# Patient Record
Sex: Female | Born: 1945
Health system: Southern US, Community
[De-identification: ages and names within clinical notes are randomized; demographics above are authoritative.]

## PROBLEM LIST (undated history)

## (undated) DIAGNOSIS — I10 Essential (primary) hypertension: Secondary | ICD-10-CM

## (undated) DIAGNOSIS — K219 Gastro-esophageal reflux disease without esophagitis: Secondary | ICD-10-CM

## (undated) DIAGNOSIS — E785 Hyperlipidemia, unspecified: Secondary | ICD-10-CM

## (undated) DIAGNOSIS — J449 Chronic obstructive pulmonary disease, unspecified: Secondary | ICD-10-CM

## (undated) DIAGNOSIS — F419 Anxiety disorder, unspecified: Secondary | ICD-10-CM

## (undated) DIAGNOSIS — F329 Major depressive disorder, single episode, unspecified: Secondary | ICD-10-CM

## (undated) DIAGNOSIS — Z972 Presence of dental prosthetic device (complete) (partial): Secondary | ICD-10-CM

## (undated) DIAGNOSIS — H409 Unspecified glaucoma: Secondary | ICD-10-CM

## (undated) DIAGNOSIS — M1990 Unspecified osteoarthritis, unspecified site: Secondary | ICD-10-CM

## (undated) DIAGNOSIS — F32A Depression, unspecified: Secondary | ICD-10-CM

## (undated) DIAGNOSIS — J381 Polyp of vocal cord and larynx: Secondary | ICD-10-CM

## (undated) HISTORY — PX: CATARACT EXTRACTION: SUR2

## (undated) HISTORY — DX: Depression, unspecified: F32.A

## (undated) HISTORY — DX: Anxiety disorder, unspecified: F41.9

## (undated) HISTORY — PX: TONSILLECTOMY: SUR1361

## (undated) HISTORY — DX: Major depressive disorder, single episode, unspecified: F32.9

---

## 2004-05-26 ENCOUNTER — Ambulatory Visit: Payer: Self-pay | Admitting: Internal Medicine

## 2005-06-01 ENCOUNTER — Ambulatory Visit: Payer: Self-pay | Admitting: Internal Medicine

## 2006-06-07 ENCOUNTER — Ambulatory Visit: Payer: Self-pay | Admitting: Internal Medicine

## 2006-11-08 ENCOUNTER — Ambulatory Visit: Payer: Self-pay | Admitting: Unknown Physician Specialty

## 2006-11-08 HISTORY — PX: COLONOSCOPY: SHX5424

## 2007-06-13 ENCOUNTER — Ambulatory Visit: Payer: Self-pay | Admitting: Internal Medicine

## 2008-06-18 ENCOUNTER — Ambulatory Visit: Payer: Self-pay | Admitting: Internal Medicine

## 2009-06-20 ENCOUNTER — Ambulatory Visit: Payer: Self-pay | Admitting: Internal Medicine

## 2010-07-02 ENCOUNTER — Ambulatory Visit: Payer: Self-pay | Admitting: Internal Medicine

## 2010-07-06 HISTORY — PX: OTHER SURGICAL HISTORY: SHX169

## 2011-08-24 ENCOUNTER — Ambulatory Visit: Payer: Self-pay | Admitting: Internal Medicine

## 2011-12-08 ENCOUNTER — Ambulatory Visit: Payer: Self-pay | Admitting: Unknown Physician Specialty

## 2012-08-29 ENCOUNTER — Ambulatory Visit: Payer: Self-pay | Admitting: Internal Medicine

## 2013-09-04 ENCOUNTER — Ambulatory Visit: Payer: Self-pay | Admitting: Internal Medicine

## 2014-04-06 HISTORY — PX: COLONOSCOPY W/ POLYPECTOMY: SHX1380

## 2014-04-09 ENCOUNTER — Ambulatory Visit: Payer: Self-pay | Admitting: Unknown Physician Specialty

## 2014-04-11 LAB — PATHOLOGY REPORT

## 2014-07-17 DIAGNOSIS — M818 Other osteoporosis without current pathological fracture: Secondary | ICD-10-CM | POA: Insufficient documentation

## 2014-09-10 ENCOUNTER — Ambulatory Visit: Payer: Self-pay | Admitting: Internal Medicine

## 2014-10-28 NOTE — Op Note (Signed)
PATIENT NAME:  Kristen Potts, Kristen Potts MR#:  [id number] DATE OF BIRTH:  [birth year]  DATE OF PROCEDURE:  12/08/2011  PREOPERATIVE DIAGNOSIS: Bilateral vocal cord polyps.   POSTOPERATIVE DIAGNOSIS: Bilateral vocal cord polyps.  OPERATION PERFORMED: Microlaryngoscopy with excision of vocal cord polyps.   SURGEON: Roena Malady, M.D.   OPERATIVE FINDINGS: Bilateral seal-flipper-type vocal cord polyps.   DESCRIPTION OF PROCEDURE: [name] was identified in the holding area, taken to the operating room and placed in the supine position. After general endotracheal anesthesia, as the patient was edentulous, a gum guard was placed. The Dedo laryngoscope was introduced into the airway and an official laryngoscopy was performed. This was then suspended. The operating microscope was brought into the field. Examination of the larynx using the Shapshay instrument showed bilateral seal flipper fluid-filled polyps. A cottonoid pledget with phenylephrine lidocaine was placed in the larynx. After five minutes this was removed. Using the [facility] microlaryngeal instruments, the cup forceps, beginning on the left, were used to grasp the polypoid disease. This was gently pulled medially. The 45 degree scissors were then used to excise the polyp. Care was taken not to involve the vocal ligament. In a similar fashion, the polyp was removed from the right. This gave excellent removal of polyps bilaterally. The cottonoid pledget was removed. The mucosal layers were then gently reapproximated. Photodocumentation was done before and after surgery. With polyps removed and no active bleeding the patient was returned to anesthesia where she was extubated in the operating room and taken to the recovery room in stable condition.   CULTURES: None.         SPECIMENS: Bilateral vocal cord polyps.   ESTIMATED BLOOD LOSS: Less than 5 mL. ____________________________ Roena Malady, MD ctm:slb D: 12/08/2011 10:19:53  ET T: 12/08/2011 12:24:40 ET JOB#: [id number]  cc: Roena Malady, MD, <Dictator> Roena Malady MD ELECTRONICALLY SIGNED 12/30/2011 8:19

## 2015-06-24 ENCOUNTER — Other Ambulatory Visit: Payer: Self-pay | Admitting: Internal Medicine

## 2015-06-24 DIAGNOSIS — Z1231 Encounter for screening mammogram for malignant neoplasm of breast: Secondary | ICD-10-CM

## 2015-09-16 ENCOUNTER — Ambulatory Visit
Admission: RE | Admit: 2015-09-16 | Discharge: 2015-09-16 | Disposition: A | Discharge status: Home / Self Care | Payer: PPO | Source: Ambulatory Visit | Attending: Internal Medicine | Admitting: Internal Medicine

## 2015-09-16 DIAGNOSIS — Z1231 Encounter for screening mammogram for malignant neoplasm of breast: Secondary | ICD-10-CM | POA: Diagnosis not present

## 2015-11-18 DIAGNOSIS — M542 Cervicalgia: Secondary | ICD-10-CM | POA: Diagnosis not present

## 2015-11-18 DIAGNOSIS — R000 Tachycardia, unspecified: Secondary | ICD-10-CM | POA: Diagnosis not present

## 2015-11-25 DIAGNOSIS — N3090 Cystitis, unspecified without hematuria: Secondary | ICD-10-CM | POA: Diagnosis not present

## 2015-11-25 DIAGNOSIS — E782 Mixed hyperlipidemia: Secondary | ICD-10-CM | POA: Insufficient documentation

## 2015-11-25 DIAGNOSIS — R5383 Other fatigue: Secondary | ICD-10-CM | POA: Diagnosis not present

## 2015-12-09 DIAGNOSIS — R5383 Other fatigue: Secondary | ICD-10-CM | POA: Diagnosis not present

## 2015-12-09 DIAGNOSIS — M5010 Cervical disc disorder with radiculopathy, unspecified cervical region: Secondary | ICD-10-CM | POA: Diagnosis not present

## 2015-12-30 DIAGNOSIS — R5383 Other fatigue: Secondary | ICD-10-CM | POA: Diagnosis not present

## 2016-01-06 DIAGNOSIS — H43811 Vitreous degeneration, right eye: Secondary | ICD-10-CM | POA: Diagnosis not present

## 2016-01-06 DIAGNOSIS — Z9841 Cataract extraction status, right eye: Secondary | ICD-10-CM | POA: Diagnosis not present

## 2016-01-06 DIAGNOSIS — H2512 Age-related nuclear cataract, left eye: Secondary | ICD-10-CM | POA: Diagnosis not present

## 2016-01-06 DIAGNOSIS — H5203 Hypermetropia, bilateral: Secondary | ICD-10-CM | POA: Diagnosis not present

## 2016-01-06 DIAGNOSIS — H52223 Regular astigmatism, bilateral: Secondary | ICD-10-CM | POA: Diagnosis not present

## 2016-01-06 DIAGNOSIS — Z961 Presence of intraocular lens: Secondary | ICD-10-CM | POA: Diagnosis not present

## 2016-01-27 DIAGNOSIS — J0100 Acute maxillary sinusitis, unspecified: Secondary | ICD-10-CM | POA: Diagnosis not present

## 2016-01-27 DIAGNOSIS — Z23 Encounter for immunization: Secondary | ICD-10-CM | POA: Diagnosis not present

## 2016-01-27 DIAGNOSIS — R5383 Other fatigue: Secondary | ICD-10-CM | POA: Diagnosis not present

## 2016-02-18 DIAGNOSIS — R5383 Other fatigue: Secondary | ICD-10-CM | POA: Diagnosis not present

## 2016-02-18 DIAGNOSIS — L820 Inflamed seborrheic keratosis: Secondary | ICD-10-CM | POA: Diagnosis not present

## 2016-04-13 DIAGNOSIS — H5203 Hypermetropia, bilateral: Secondary | ICD-10-CM | POA: Diagnosis not present

## 2016-04-13 DIAGNOSIS — H2512 Age-related nuclear cataract, left eye: Secondary | ICD-10-CM | POA: Diagnosis not present

## 2016-04-13 DIAGNOSIS — H43811 Vitreous degeneration, right eye: Secondary | ICD-10-CM | POA: Diagnosis not present

## 2016-04-13 DIAGNOSIS — Z961 Presence of intraocular lens: Secondary | ICD-10-CM | POA: Diagnosis not present

## 2016-04-13 DIAGNOSIS — Z9841 Cataract extraction status, right eye: Secondary | ICD-10-CM | POA: Diagnosis not present

## 2016-04-13 DIAGNOSIS — H52223 Regular astigmatism, bilateral: Secondary | ICD-10-CM | POA: Diagnosis not present

## 2016-05-11 DIAGNOSIS — R5383 Other fatigue: Secondary | ICD-10-CM | POA: Diagnosis not present

## 2016-05-26 DIAGNOSIS — R5383 Other fatigue: Secondary | ICD-10-CM | POA: Diagnosis not present

## 2016-05-26 DIAGNOSIS — M769 Unspecified enthesopathy, lower limb, excluding foot: Secondary | ICD-10-CM | POA: Diagnosis not present

## 2016-06-23 DIAGNOSIS — M818 Other osteoporosis without current pathological fracture: Secondary | ICD-10-CM | POA: Diagnosis not present

## 2016-06-23 DIAGNOSIS — E782 Mixed hyperlipidemia: Secondary | ICD-10-CM | POA: Diagnosis not present

## 2016-06-23 DIAGNOSIS — Z79899 Other long term (current) drug therapy: Secondary | ICD-10-CM | POA: Diagnosis not present

## 2016-06-30 DIAGNOSIS — Z0000 Encounter for general adult medical examination without abnormal findings: Secondary | ICD-10-CM | POA: Diagnosis not present

## 2016-06-30 DIAGNOSIS — J431 Panlobular emphysema: Secondary | ICD-10-CM | POA: Insufficient documentation

## 2016-06-30 DIAGNOSIS — R7989 Other specified abnormal findings of blood chemistry: Secondary | ICD-10-CM | POA: Insufficient documentation

## 2016-06-30 DIAGNOSIS — J439 Emphysema, unspecified: Secondary | ICD-10-CM | POA: Diagnosis not present

## 2016-06-30 DIAGNOSIS — M5414 Radiculopathy, thoracic region: Secondary | ICD-10-CM | POA: Diagnosis not present

## 2016-06-30 DIAGNOSIS — M818 Other osteoporosis without current pathological fracture: Secondary | ICD-10-CM | POA: Diagnosis not present

## 2016-07-02 ENCOUNTER — Other Ambulatory Visit: Payer: Self-pay | Admitting: Internal Medicine

## 2016-07-02 DIAGNOSIS — Z1231 Encounter for screening mammogram for malignant neoplasm of breast: Secondary | ICD-10-CM

## 2016-07-20 DIAGNOSIS — M810 Age-related osteoporosis without current pathological fracture: Secondary | ICD-10-CM | POA: Diagnosis not present

## 2016-07-20 DIAGNOSIS — M818 Other osteoporosis without current pathological fracture: Secondary | ICD-10-CM | POA: Diagnosis not present

## 2016-09-21 ENCOUNTER — Ambulatory Visit
Admission: RE | Admit: 2016-09-21 | Discharge: 2016-09-21 | Disposition: A | Discharge status: Home / Self Care | Payer: PPO | Source: Ambulatory Visit | Attending: Internal Medicine | Admitting: Internal Medicine

## 2016-09-21 DIAGNOSIS — Z1231 Encounter for screening mammogram for malignant neoplasm of breast: Secondary | ICD-10-CM | POA: Diagnosis not present

## 2016-10-12 DIAGNOSIS — H5203 Hypermetropia, bilateral: Secondary | ICD-10-CM | POA: Diagnosis not present

## 2016-10-12 DIAGNOSIS — H52223 Regular astigmatism, bilateral: Secondary | ICD-10-CM | POA: Diagnosis not present

## 2016-10-12 DIAGNOSIS — H2512 Age-related nuclear cataract, left eye: Secondary | ICD-10-CM | POA: Diagnosis not present

## 2016-10-12 DIAGNOSIS — Z961 Presence of intraocular lens: Secondary | ICD-10-CM | POA: Diagnosis not present

## 2016-10-12 DIAGNOSIS — Z9841 Cataract extraction status, right eye: Secondary | ICD-10-CM | POA: Diagnosis not present

## 2016-10-12 DIAGNOSIS — H43811 Vitreous degeneration, right eye: Secondary | ICD-10-CM | POA: Diagnosis not present

## 2016-10-26 DIAGNOSIS — M76899 Other specified enthesopathies of unspecified lower limb, excluding foot: Secondary | ICD-10-CM | POA: Diagnosis not present

## 2016-10-26 DIAGNOSIS — Z0000 Encounter for general adult medical examination without abnormal findings: Secondary | ICD-10-CM | POA: Insufficient documentation

## 2016-10-26 DIAGNOSIS — J431 Panlobular emphysema: Secondary | ICD-10-CM | POA: Diagnosis not present

## 2017-02-22 DIAGNOSIS — N3090 Cystitis, unspecified without hematuria: Secondary | ICD-10-CM | POA: Diagnosis not present

## 2017-02-22 DIAGNOSIS — F3341 Major depressive disorder, recurrent, in partial remission: Secondary | ICD-10-CM | POA: Diagnosis not present

## 2017-02-22 DIAGNOSIS — J431 Panlobular emphysema: Secondary | ICD-10-CM | POA: Diagnosis not present

## 2017-02-22 DIAGNOSIS — E782 Mixed hyperlipidemia: Secondary | ICD-10-CM | POA: Diagnosis not present

## 2017-02-22 DIAGNOSIS — M818 Other osteoporosis without current pathological fracture: Secondary | ICD-10-CM | POA: Diagnosis not present

## 2017-03-15 DIAGNOSIS — F3341 Major depressive disorder, recurrent, in partial remission: Secondary | ICD-10-CM | POA: Diagnosis not present

## 2017-03-15 DIAGNOSIS — F5104 Psychophysiologic insomnia: Secondary | ICD-10-CM | POA: Diagnosis not present

## 2017-04-07 DIAGNOSIS — M76899 Other specified enthesopathies of unspecified lower limb, excluding foot: Secondary | ICD-10-CM | POA: Diagnosis not present

## 2017-04-07 DIAGNOSIS — E782 Mixed hyperlipidemia: Secondary | ICD-10-CM | POA: Diagnosis not present

## 2017-04-07 DIAGNOSIS — R7989 Other specified abnormal findings of blood chemistry: Secondary | ICD-10-CM | POA: Diagnosis not present

## 2017-04-07 DIAGNOSIS — Z79899 Other long term (current) drug therapy: Secondary | ICD-10-CM | POA: Diagnosis not present

## 2017-04-07 DIAGNOSIS — J431 Panlobular emphysema: Secondary | ICD-10-CM | POA: Diagnosis not present

## 2017-04-07 DIAGNOSIS — Z23 Encounter for immunization: Secondary | ICD-10-CM | POA: Diagnosis not present

## 2017-04-26 DIAGNOSIS — H43811 Vitreous degeneration, right eye: Secondary | ICD-10-CM | POA: Diagnosis not present

## 2017-04-26 DIAGNOSIS — Z961 Presence of intraocular lens: Secondary | ICD-10-CM | POA: Diagnosis not present

## 2017-04-26 DIAGNOSIS — H52223 Regular astigmatism, bilateral: Secondary | ICD-10-CM | POA: Diagnosis not present

## 2017-04-26 DIAGNOSIS — H5203 Hypermetropia, bilateral: Secondary | ICD-10-CM | POA: Diagnosis not present

## 2017-04-26 DIAGNOSIS — Z9841 Cataract extraction status, right eye: Secondary | ICD-10-CM | POA: Diagnosis not present

## 2017-04-26 DIAGNOSIS — H2512 Age-related nuclear cataract, left eye: Secondary | ICD-10-CM | POA: Diagnosis not present

## 2017-07-13 DIAGNOSIS — E559 Vitamin D deficiency, unspecified: Secondary | ICD-10-CM | POA: Diagnosis not present

## 2017-07-13 DIAGNOSIS — E782 Mixed hyperlipidemia: Secondary | ICD-10-CM | POA: Diagnosis not present

## 2017-07-13 DIAGNOSIS — Z79899 Other long term (current) drug therapy: Secondary | ICD-10-CM | POA: Diagnosis not present

## 2017-07-20 DIAGNOSIS — F3341 Major depressive disorder, recurrent, in partial remission: Secondary | ICD-10-CM | POA: Diagnosis not present

## 2017-07-20 DIAGNOSIS — Z0000 Encounter for general adult medical examination without abnormal findings: Secondary | ICD-10-CM | POA: Diagnosis not present

## 2017-07-20 DIAGNOSIS — J431 Panlobular emphysema: Secondary | ICD-10-CM | POA: Diagnosis not present

## 2017-07-21 IMAGING — MG MM DIGITAL SCREENING BILAT W/ CAD
4 series · 4 of 4 positions shown · non-contrast
Comparison: Previous exam(s).

CLINICAL DATA: Screening.

EXAM:
DIGITAL SCREENING BILATERAL MAMMOGRAM WITH CAD

[R MLO]
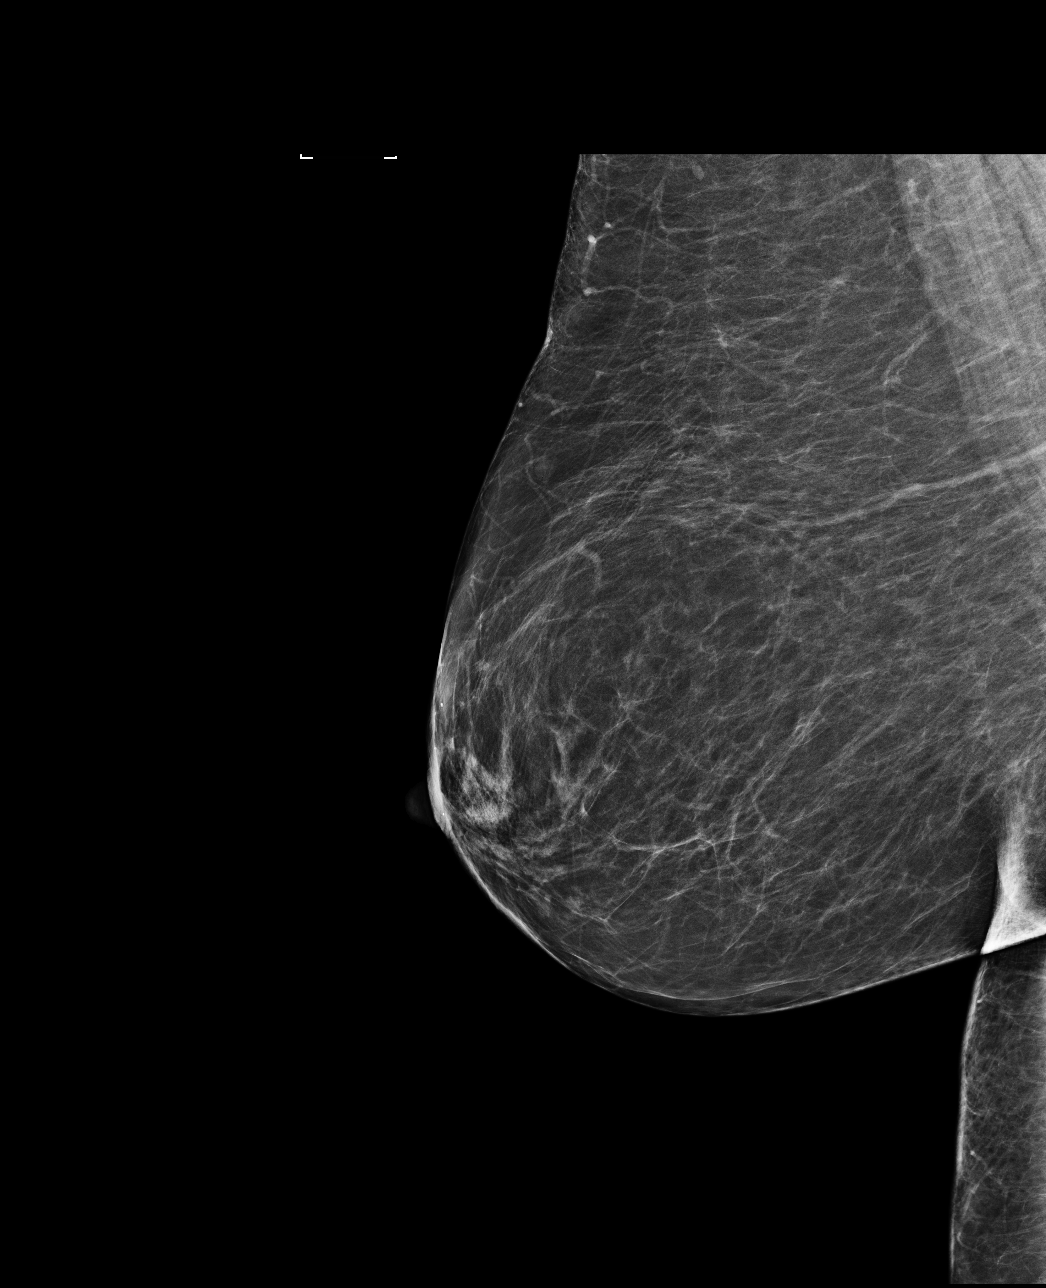

[L CC]
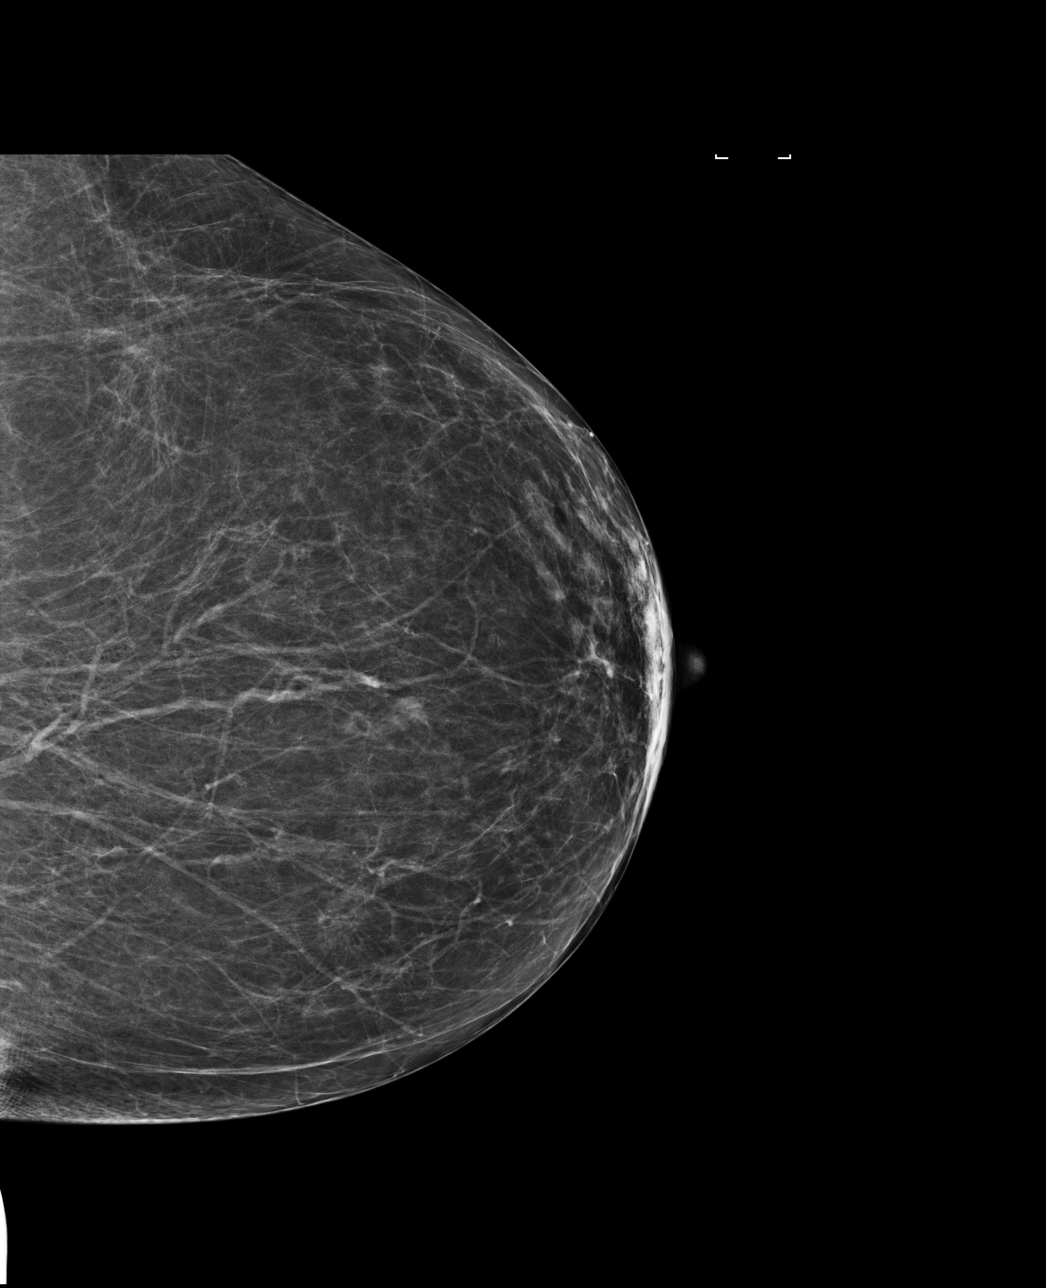

[R CC]
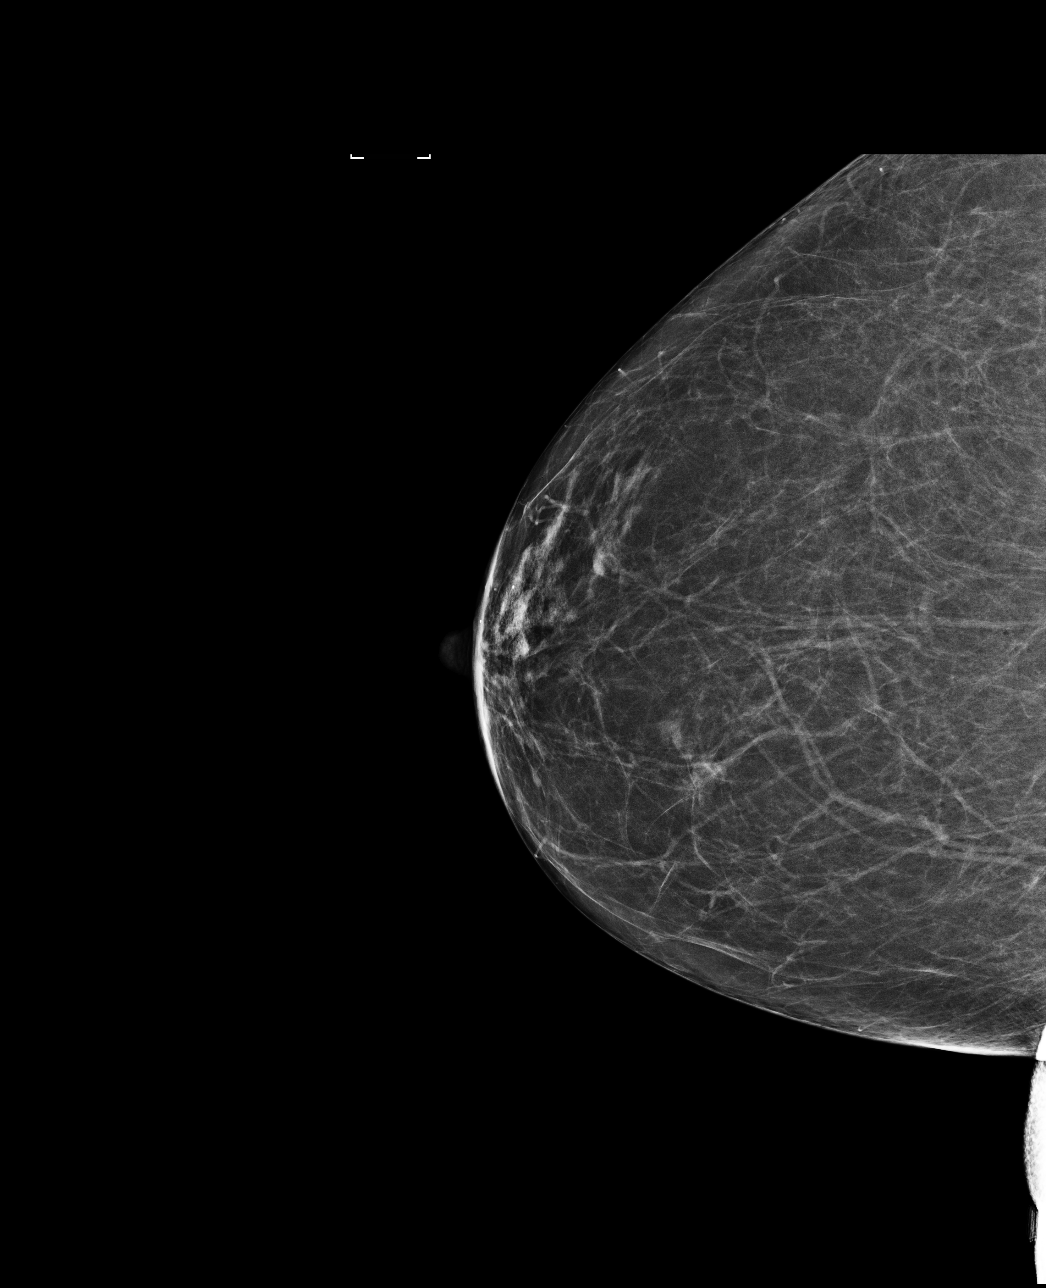

[L MLO]
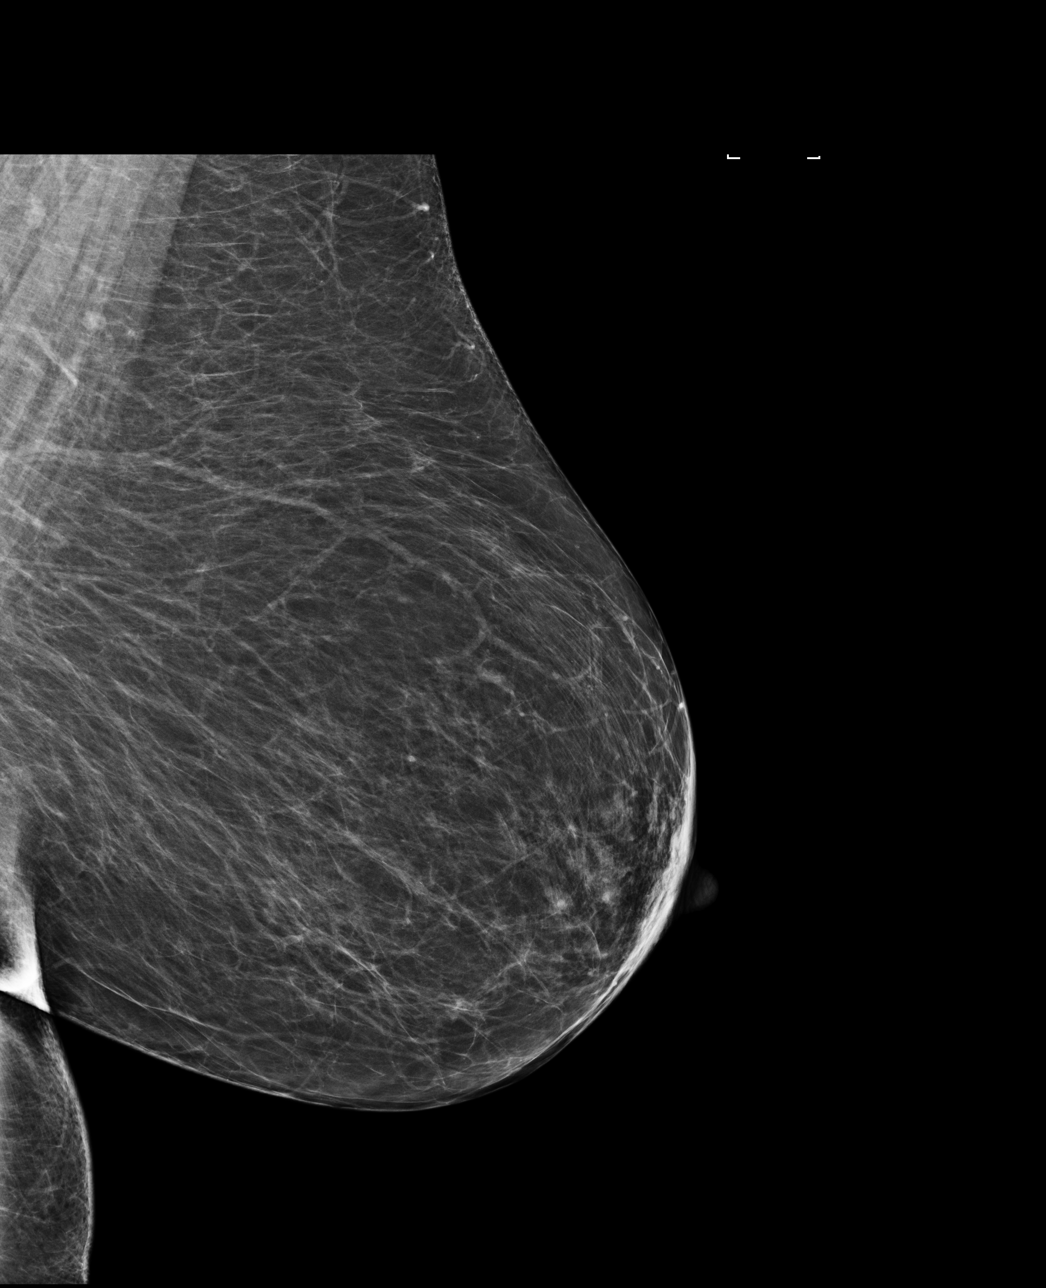

[4 of 4 positions shown; findings below may reference images not displayed]

ACR Breast Density Category b: There are scattered areas of
fibroglandular density.
FINDINGS: There are no findings suspicious for malignancy. Images were
processed with CAD.
IMPRESSION: No mammographic evidence of malignancy. A result letter of this
screening mammogram will be mailed directly to the patient.

RECOMMENDATION:
Screening mammogram in one year. (Code:AS-G-LCT)

BI-RADS CATEGORY  1: Negative.

## 2017-08-04 DIAGNOSIS — F3341 Major depressive disorder, recurrent, in partial remission: Secondary | ICD-10-CM | POA: Diagnosis not present

## 2017-08-04 DIAGNOSIS — F5104 Psychophysiologic insomnia: Secondary | ICD-10-CM | POA: Diagnosis not present

## 2017-08-16 DIAGNOSIS — F3341 Major depressive disorder, recurrent, in partial remission: Secondary | ICD-10-CM | POA: Diagnosis not present

## 2017-08-16 DIAGNOSIS — F5104 Psychophysiologic insomnia: Secondary | ICD-10-CM | POA: Diagnosis not present

## 2017-08-24 DIAGNOSIS — J431 Panlobular emphysema: Secondary | ICD-10-CM | POA: Diagnosis not present

## 2017-08-24 DIAGNOSIS — Z86010 Personal history of colonic polyps: Secondary | ICD-10-CM | POA: Diagnosis not present

## 2017-09-01 DIAGNOSIS — M76899 Other specified enthesopathies of unspecified lower limb, excluding foot: Secondary | ICD-10-CM | POA: Diagnosis not present

## 2017-09-01 DIAGNOSIS — D369 Benign neoplasm, unspecified site: Secondary | ICD-10-CM | POA: Insufficient documentation

## 2017-09-01 DIAGNOSIS — F3341 Major depressive disorder, recurrent, in partial remission: Secondary | ICD-10-CM | POA: Diagnosis not present

## 2017-09-16 ENCOUNTER — Other Ambulatory Visit: Payer: Self-pay | Admitting: Internal Medicine

## 2017-09-16 DIAGNOSIS — Z1231 Encounter for screening mammogram for malignant neoplasm of breast: Secondary | ICD-10-CM

## 2017-09-27 DIAGNOSIS — J431 Panlobular emphysema: Secondary | ICD-10-CM | POA: Diagnosis not present

## 2017-09-27 DIAGNOSIS — R5382 Chronic fatigue, unspecified: Secondary | ICD-10-CM | POA: Diagnosis not present

## 2017-09-28 ENCOUNTER — Ambulatory Visit
Admission: RE | Admit: 2017-09-28 | Discharge: 2017-09-28 | Disposition: A | Discharge status: Home / Self Care | Payer: PPO | Source: Ambulatory Visit | Attending: Internal Medicine | Admitting: Internal Medicine

## 2017-09-28 DIAGNOSIS — Z1231 Encounter for screening mammogram for malignant neoplasm of breast: Secondary | ICD-10-CM | POA: Insufficient documentation

## 2017-10-05 ENCOUNTER — Other Ambulatory Visit: Payer: Self-pay

## 2017-10-05 ENCOUNTER — Ambulatory Visit (INDEPENDENT_AMBULATORY_CARE_PROVIDER_SITE_OTHER): Payer: PPO | Admitting: Psychiatry

## 2017-10-05 ENCOUNTER — Encounter: Payer: Self-pay | Admitting: Psychiatry

## 2017-10-05 VITALS — BP 168/90 | HR 102 | Temp 98.3°F | Ht 62.6 in | Wt 158.4 lb

## 2017-10-05 DIAGNOSIS — F411 Generalized anxiety disorder: Secondary | ICD-10-CM

## 2017-10-05 DIAGNOSIS — K219 Gastro-esophageal reflux disease without esophagitis: Secondary | ICD-10-CM | POA: Insufficient documentation

## 2017-10-05 DIAGNOSIS — F330 Major depressive disorder, recurrent, mild: Secondary | ICD-10-CM

## 2017-10-05 DIAGNOSIS — H409 Unspecified glaucoma: Secondary | ICD-10-CM | POA: Insufficient documentation

## 2017-10-05 DIAGNOSIS — M1990 Unspecified osteoarthritis, unspecified site: Secondary | ICD-10-CM | POA: Insufficient documentation

## 2017-10-05 MED ORDER — FLUOXETINE HCL 10 MG PO TABS: 10.0000 mg | ORAL_TABLET | Freq: Every day | ORAL | 1 refills | Status: DC

## 2017-10-05 MED ORDER — TRAZODONE HCL 50 MG PO TABS: 25.0000 mg | ORAL_TABLET | Freq: Every day | ORAL | 1 refills | Status: DC

## 2017-10-05 MED ORDER — HYDROXYZINE HCL 10 MG PO TABS: 10.0000 mg | ORAL_TABLET | Freq: Three times a day (TID) | ORAL | 1 refills | Status: DC | PRN

## 2017-10-05 NOTE — Patient Instructions (Addendum)
Trazodone tablets What is this medicine? TRAZODONE (TRAZ oh done) is used to treat depression. This medicine may be used for other purposes; ask your health care provider or pharmacist if you have questions. COMMON BRAND NAME(S): Desyrel What should I tell my health care provider before I take this medicine? They need to know if you have any of these conditions: -attempted suicide or thinking about it -bipolar disorder -bleeding problems -glaucoma -heart disease, or previous heart attack -irregular heart beat -kidney or liver disease -low levels of sodium in the blood -an unusual or allergic reaction to trazodone, other medicines, foods, dyes or preservatives -pregnant or trying to get pregnant -breast-feeding How should I use this medicine? Take this medicine by mouth with a glass of water. Follow the directions on the prescription label. Take this medicine shortly after a meal or a light snack. Take your medicine at regular intervals. Do not take your medicine more often than directed. Do not stop taking this medicine suddenly except upon the advice of your doctor. Stopping this medicine too quickly may cause serious side effects or your condition may worsen. A special MedGuide will be given to you by the pharmacist with each prescription and refill. Be sure to read this information carefully each time. Talk to your pediatrician regarding the use of this medicine in children. Special care may be needed. Overdosage: If you think you have taken too much of this medicine contact a poison control center or emergency room at once. NOTE: This medicine is only for you. Do not share this medicine with others. What if I miss a dose? If you miss a dose, take it as soon as you can. If it is almost time for your next dose, take only that dose. Do not take double or extra doses. What may interact with this medicine? Do not take this medicine with any of the following medications: -certain medicines  for fungal infections like fluconazole, itraconazole, ketoconazole, posaconazole, voriconazole -cisapride -dofetilide -dronedarone -linezolid -MAOIs like Carbex, Eldepryl, Marplan, Nardil, and Parnate -mesoridazine -methylene blue (injected into a vein) -pimozide -saquinavir -thioridazine -ziprasidone This medicine may also interact with the following medications: -alcohol -antiviral medicines for HIV or AIDS -aspirin and aspirin-like medicines -barbiturates like phenobarbital -certain medicines for blood pressure, heart disease, irregular heart beat -certain medicines for depression, anxiety, or psychotic disturbances -certain medicines for migraine headache like almotriptan, eletriptan, frovatriptan, naratriptan, rizatriptan, sumatriptan, zolmitriptan -certain medicines for seizures like carbamazepine and phenytoin -certain medicines for sleep -certain medicines that treat or prevent blood clots like dalteparin, enoxaparin, warfarin -digoxin -fentanyl -lithium -NSAIDS, medicines for pain and inflammation, like ibuprofen or naproxen -other medicines that prolong the QT interval (cause an abnormal heart rhythm) -rasagiline -supplements like St. John's wort, kava kava, valerian -tramadol -tryptophan This list may not describe all possible interactions. Give your health care provider a list of all the medicines, herbs, non-prescription drugs, or dietary supplements you use. Also tell them if you smoke, drink alcohol, or use illegal drugs. Some items may interact with your medicine. What should I watch for while using this medicine? Tell your doctor if your symptoms do not get better or if they get worse. Visit your doctor or health care professional for regular checks on your progress. Because it may take several weeks to see the full effects of this medicine, it is important to continue your treatment as prescribed by your doctor. Patients and their families should watch out for new  or worsening thoughts of suicide or depression. Also   watch out for sudden changes in feelings such as feeling anxious, agitated, panicky, irritable, hostile, aggressive, impulsive, severely restless, overly excited and hyperactive, or not being able to sleep. If this happens, especially at the beginning of treatment or after a change in dose, call your health care professional. Dennis Bast may get drowsy or dizzy. Do not drive, use machinery, or do anything that needs mental alertness until you know how this medicine affects you. Do not stand or sit up quickly, especially if you are an older patient. This reduces the risk of dizzy or fainting spells. Alcohol may interfere with the effect of this medicine. Avoid alcoholic drinks. This medicine may cause dry eyes and blurred vision. If you wear contact lenses you may feel some discomfort. Lubricating drops may help. See your eye doctor if the problem does not go away or is severe. Your mouth may get dry. Chewing sugarless gum, sucking hard candy and drinking plenty of water may help. Contact your doctor if the problem does not go away or is severe. What side effects may I notice from receiving this medicine? Side effects that you should report to your doctor or health care professional as soon as possible: -allergic reactions like skin rash, itching or hives, swelling of the face, lips, or tongue -elevated mood, decreased need for sleep, racing thoughts, impulsive behavior -confusion -fast, irregular heartbeat -feeling faint or lightheaded, falls -feeling agitated, angry, or irritable -loss of balance or coordination -painful or prolonged erections -restlessness, pacing, inability to keep still -suicidal thoughts or other mood changes -tremors -trouble sleeping -seizures -unusual bleeding or bruising Side effects that usually do not require medical attention (report to your doctor or health care professional if they continue or are bothersome): -change in  sex drive or performance -change in appetite or weight -constipation -headache -muscle aches or pains -nausea This list may not describe all possible side effects. Call your doctor for medical advice about side effects. You may report side effects to FDA at 1-800-FDA-1088. Where should I keep my medicine? Keep out of the reach of children. Store at room temperature between 15 and 30 degrees C (59 to 86 degrees F). Protect from light. Keep container tightly closed. Throw away any unused medicine after the expiration date. NOTE: This sheet is a summary. It may not cover all possible information. If you have questions about this medicine, talk to your doctor, pharmacist, or health care provider.  2018 Elsevier/Gold Standard (2015-11-21 16:57:05) Hydroxyzine capsules or tablets What is this medicine? HYDROXYZINE (hye [place] i zeen) is an antihistamine. This medicine is used to treat allergy symptoms. It is also used to treat anxiety and tension. This medicine can be used with other medicines to induce sleep before surgery. This medicine may be used for other purposes; ask your health care provider or pharmacist if you have questions. COMMON BRAND NAME(S): ANX, Atarax, Rezine, Vistaril What should I tell my health care provider before I take this medicine? They need to know if you have any of these conditions: -any chronic illness -difficulty passing urine -glaucoma -heart disease -kidney disease -liver disease -lung disease -an unusual or allergic reaction to hydroxyzine, cetirizine, other medicines, foods, dyes, or preservatives -pregnant or trying to get pregnant -breast-feeding How should I use this medicine? Take this medicine by mouth with a full glass of water. Follow the directions on the prescription label. You may take this medicine with food or on an empty stomach. Take your medicine at regular intervals. Do not take your medicine  more often than directed. Talk to your  pediatrician regarding the use of this medicine in children. Special care may be needed. While this drug may be prescribed for children as young as 60 years of age for selected conditions, precautions do apply. Patients over 37 years old may have a stronger reaction and need a smaller dose. Overdosage: If you think you have taken too much of this medicine contact a poison control center or emergency room at once. NOTE: This medicine is only for you. Do not share this medicine with others. What if I miss a dose? If you miss a dose, take it as soon as you can. If it is almost time for your next dose, take only that dose. Do not take double or extra doses. What may interact with this medicine? -alcohol -barbiturate medicines for sleep or seizures -medicines for colds, allergies -medicines for depression, anxiety, or emotional disturbances -medicines for pain -medicines for sleep -muscle relaxants This list may not describe all possible interactions. Give your health care provider a list of all the medicines, herbs, non-prescription drugs, or dietary supplements you use. Also tell them if you smoke, drink alcohol, or use illegal drugs. Some items may interact with your medicine. What should I watch for while using this medicine? Tell your doctor or health care professional if your symptoms do not improve. You may get drowsy or dizzy. Do not drive, use machinery, or do anything that needs mental alertness until you know how this medicine affects you. Do not stand or sit up quickly, especially if you are an older patient. This reduces the risk of dizzy or fainting spells. Alcohol may interfere with the effect of this medicine. Avoid alcoholic drinks. Your mouth may get dry. Chewing sugarless gum or sucking hard candy, and drinking plenty of water may help. Contact your doctor if the problem does not go away or is severe. This medicine may cause dry eyes and blurred vision. If you wear contact lenses you  may feel some discomfort. Lubricating drops may help. See your eye doctor if the problem does not go away or is severe. If you are receiving skin tests for allergies, tell your doctor you are using this medicine. What side effects may I notice from receiving this medicine? Side effects that you should report to your doctor or health care professional as soon as possible: -fast or irregular heartbeat -difficulty passing urine -seizures -slurred speech or confusion -tremor Side effects that usually do not require medical attention (report to your doctor or health care professional if they continue or are bothersome): -constipation -drowsiness -fatigue -headache -stomach upset This list may not describe all possible side effects. Call your doctor for medical advice about side effects. You may report side effects to FDA at 1-800-FDA-1088. Where should I keep my medicine? Keep out of the reach of children. Store at room temperature between 15 and 30 degrees C (59 and 86 degrees F). Keep container tightly closed. Throw away any unused medicine after the expiration date. NOTE: This sheet is a summary. It may not cover all possible information. If you have questions about this medicine, talk to your doctor, pharmacist, or health care provider.  2018 Elsevier/Gold Standard (2007-11-04 14:50:59) Fluoxetine capsules or tablets (Depression/Mood Disorders) What is this medicine? FLUOXETINE (floo OX e teen) belongs to a class of drugs known as selective serotonin reuptake inhibitors (SSRIs). It helps to treat mood problems such as depression, obsessive compulsive disorder, and panic attacks. It can also treat certain eating  disorders. This medicine may be used for other purposes; ask your health care provider or pharmacist if you have questions. COMMON BRAND NAME(S): Prozac What should I tell my health care provider before I take this medicine? They need to know if you have any of these  conditions: -bipolar disorder or a family history of bipolar disorder -bleeding disorders -glaucoma -heart disease -liver disease -low levels of sodium in the blood -seizures -suicidal thoughts, plans, or attempt; a previous suicide attempt by you or a family member -take MAOIs like Carbex, Eldepryl, Marplan, Nardil, and Parnate -take medicines that treat or prevent blood clots -thyroid disease -an unusual or allergic reaction to fluoxetine, other medicines, foods, dyes, or preservatives -pregnant or trying to get pregnant -breast-feeding How should I use this medicine? Take this medicine by mouth with a glass of water. Follow the directions on the prescription label. You can take this medicine with or without food. Take your medicine at regular intervals. Do not take it more often than directed. Do not stop taking this medicine suddenly except upon the advice of your doctor. Stopping this medicine too quickly may cause serious side effects or your condition may worsen. A special MedGuide will be given to you by the pharmacist with each prescription and refill. Be sure to read this information carefully each time. Talk to your pediatrician regarding the use of this medicine in children. While this drug may be prescribed for children as young as 7 years for selected conditions, precautions do apply. Overdosage: If you think you have taken too much of this medicine contact a poison control center or emergency room at once. NOTE: This medicine is only for you. Do not share this medicine with others. What if I miss a dose? If you miss a dose, skip the missed dose and go back to your regular dosing schedule. Do not take double or extra doses. What may interact with this medicine? Do not take this medicine with any of the following medications: -other medicines containing fluoxetine, like Sarafem or Symbyax -cisapride -linezolid -MAOIs like Carbex, Eldepryl, Marplan, Nardil, and  Parnate -methylene blue (injected into a vein) -pimozide -thioridazine This medicine may also interact with the following medications: -alcohol -amphetamines -aspirin and aspirin-like medicines -carbamazepine -certain medicines for depression, anxiety, or psychotic disturbances -certain medicines for migraine headaches like almotriptan, eletriptan, frovatriptan, naratriptan, rizatriptan, sumatriptan, zolmitriptan -digoxin -diuretics -fentanyl -flecainide -furazolidone -isoniazid -lithium -medicines for sleep -medicines that treat or prevent blood clots like warfarin, enoxaparin, and dalteparin -NSAIDs, medicines for pain and inflammation, like ibuprofen or naproxen -phenytoin -procarbazine -propafenone -rasagiline -ritonavir -supplements like St. John's wort, kava kava, valerian -tramadol -tryptophan -vinblastine This list may not describe all possible interactions. Give your health care provider a list of all the medicines, herbs, non-prescription drugs, or dietary supplements you use. Also tell them if you smoke, drink alcohol, or use illegal drugs. Some items may interact with your medicine. What should I watch for while using this medicine? Tell your doctor if your symptoms do not get better or if they get worse. Visit your doctor or health care professional for regular checks on your progress. Because it may take several weeks to see the full effects of this medicine, it is important to continue your treatment as prescribed by your doctor. Patients and their families should watch out for new or worsening thoughts of suicide or depression. Also watch out for sudden changes in feelings such as feeling anxious, agitated, panicky, irritable, hostile, aggressive, impulsive, severely restless, overly excited  and hyperactive, or not being able to sleep. If this happens, especially at the beginning of treatment or after a change in dose, call your health care professional. Dennis Bast may get  drowsy or dizzy. Do not drive, use machinery, or do anything that needs mental alertness until you know how this medicine affects you. Do not stand or sit up quickly, especially if you are an older patient. This reduces the risk of dizzy or fainting spells. Alcohol may interfere with the effect of this medicine. Avoid alcoholic drinks. Your mouth may get dry. Chewing sugarless gum or sucking hard candy, and drinking plenty of water may help. Contact your doctor if the problem does not go away or is severe. This medicine may affect blood sugar levels. If you have diabetes, check with your doctor or health care professional before you change your diet or the dose of your diabetic medicine. What side effects may I notice from receiving this medicine? Side effects that you should report to your doctor or health care professional as soon as possible: -allergic reactions like skin rash, itching or hives, swelling of the face, lips, or tongue -anxious -black, tarry stools -breathing problems -changes in vision -confusion -elevated mood, decreased need for sleep, racing thoughts, impulsive behavior -eye pain -fast, irregular heartbeat -feeling faint or lightheaded, falls -feeling agitated, angry, or irritable -hallucination, loss of contact with reality -loss of balance or coordination -loss of memory -painful or prolonged erections -restlessness, pacing, inability to keep still -seizures -stiff muscles -suicidal thoughts or other mood changes -trouble sleeping -unusual bleeding or bruising -unusually weak or tired -vomiting Side effects that usually do not require medical attention (report to your doctor or health care professional if they continue or are bothersome): -change in appetite or weight -change in sex drive or performance -diarrhea -dry mouth -headache -increased sweating -nausea -tremors This list may not describe all possible side effects. Call your doctor for medical  advice about side effects. You may report side effects to FDA at 1-800-FDA-1088. Where should I keep my medicine? Keep out of the reach of children. Store at room temperature between 15 and 30 degrees C (59 and 86 degrees F). Throw away any unused medicine after the expiration date. NOTE: This sheet is a summary. It may not cover all possible information. If you have questions about this medicine, talk to your doctor, pharmacist, or health care provider.  2018 Elsevier/Gold Standard (2015-11-23 15:55:27)    PLEASE START TAKING FLUOXETINE 10 MG WITH BREAKFAST FOR DEPRESSION AND ANXIETY.  PLEASE START TAKING TRAZODONE 25-50 MG ( HALF TO ONE TABLET ) AT BEDTIME FOR SLEEP. TAKE IT 30 MINUTES PRIOR TO BEDTIME .TAKE IT EARLY SO THAT YOU DO NOT FEEL GROGGY IN THE MORNING.  PLEASE START TAKING HYDROXYZINE 10 MG THREE TIMES A DAY AS NEEDED FOR ANXIETY . IF YOU DO NOT NEED IT , DO NOT TAKE IT . YOU DO NOT HAVE TO TAKE IT EVERY DAY.

## 2017-10-05 NOTE — Progress Notes (Signed)
Psychiatric Initial Adult Assessment   Patient Identification: Kristen Potts MRN:  [id number] Date of Evaluation:  10/05/2017 Referral Source: Dr.Mark Sabra Heck  Chief Complaint:  ' I am anxious.' Chief Complaint    Establish Care; Anxiety; Insomnia; Panic Attack; Fatigue     Visit Diagnosis:    ICD-10-CM   1. GAD (generalized anxiety disorder) F41.1 FLUoxetine (PROZAC) 10 MG tablet    traZODone (DESYREL) 50 MG tablet    hydrOXYzine (ATARAX/VISTARIL) 10 MG tablet  2. MDD (major depressive disorder), recurrent episode, mild (HCC) F33.0 FLUoxetine (PROZAC) 10 MG tablet    traZODone (DESYREL) 50 MG tablet    hydrOXYzine (ATARAX/VISTARIL) 10 MG tablet    History of Present Illness:  Kristen Potts is a 72 year old Caucasian female, widowed, lives in [place], on [address], presented to the clinic today to establish care.  Patient has a history of depression and anxiety ,GERD, glaucoma, arthritis, vitamin D deficiency, hypertension, hyperlipidemia and osteoporosis.  [name] today presented along with her Sister Kristen Potts.  [name] was evaluated alone initially and her sister participated in the evaluation towards the end of the session.  Patient today reports worsening depressive symptoms like lack of motivation, fatigue, low energy, anhedonia, sleep problems, lack of appetite, feeling depressed and so on.  She reports this has been going on since the past 1 year.  She reports she has tried several medications but does not think anything is helping.  She reports she is currently on aripiprazole prescribed by PMD but does not think that is effective.  Patient reports past trials of Celexa, Cymbalta, Zoloft, Rexulti, Wellbutrin, remeron, Zyprexa.  Per sister patient stays on the medication for a few days and then stops taking them.  Patient does not give medication time to work.  Patient also reports anxiety symptoms.  She reports she feels a dread when she wakes up in the morning.  She reports she worries about being  alone.  She reports  feeling nervous inside her all the time.  She also reports trouble relaxing as well as feeling afraid something awful might happen.  She reports this happens on a regular basis and has been worsening since the past few months.  She reports she has tried medications prescribed by her primary medical doctor like clonazepam, lorazepam, Xanax, chlorazepate and so on.  Patient reports those medications makes her even more nervous the next day and hence she stopped taking them.  Patient reports sleep is affected.  She reports she has difficulty falling asleep as well as maintaining sleep.  She reports trying medications like Advil PM, temazepam, Zyprexa, Abilify, remeron -does not think any of those medications worked.  She reports several psychosocial stressors.  She reports several deaths in her family since 47.  Patient's husband died in 2013-10-22 due to respiratory infection.  They were married for 50 years.  She reports he was her' rock'.  Soon after that her brother died of a respiratory infection.  Later on her mother died due to congestive heart failure.  Her sister thereafter would come and stay with her and was supportive.  However her sister passed away due to a stroke.  Patient reports her pet dog also died 2 years ago.  Patient reports it was a struggle dealing with all these deaths in the family.  She reports her symptoms got worse and worse due to the same.  Patient currently has support from her sister who lives close to her.  Patient's sister reports they see each other on a regular basis and  goes out to eat several times a week.  They also talk to each other daily.  Patient also has sons who are adults and also a grandchild who is supportive.  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  She reports she continues to work at least 2 times per week just to keep herself active.  Denies any substance abuse problems.  She does smokes cigarettes and is currently  trying to cut down.  Associated Signs/Symptoms: Depression Symptoms:  depressed mood, insomnia, fatigue, difficulty concentrating, anxiety, panic attacks, loss of energy/fatigue, disturbed sleep, decreased appetite, (Hypo) Manic Symptoms:  denies Anxiety Symptoms:  Excessive Worry, Panic Symptoms, Psychotic Symptoms:  denies PTSD Symptoms: Negative  Past Psychiatric History: History of depression and anxiety, was being managed by PMD since the past 2 years.  Patient denies any inpatient mental health admissions.  Patient denies any history of suicide.  Previous Psychotropic Medications: Yes , Celexa, Zoloft, Wellbutrin, Cymbalta , rexulti, lorazepam, clorazepate, temazepam, Zyprexa, Abilify, mirtazapine, Klonopin, Xanax.  Substance Abuse History in the last 12 months:  No.  Consequences of Substance Abuse: Negative  Past Medical History:  Past Medical History:  Diagnosis Date  . Anxiety   . Depression     Past Surgical History:  Procedure Laterality Date  . CATARACT EXTRACTION    . TONSILLECTOMY      Family Psychiatric History: Brother-alcohol abuse, drug abuse.  Sister -depression  Family History:  Family History  Problem Relation Age of Onset  . Alcohol abuse Brother   . Drug abuse Brother   . Breast cancer Neg Hx     Social History:   Social History   Socioeconomic History  . Marital status: Widowed    Spouse name: Not on file  . Number of children: 2  . Years of education: Not on file  . Highest education level: 11th grade  Occupational History    Comment: retired  Scientific laboratory technician  . Financial resource strain: Not hard at all  . Food insecurity:    Worry: Never true    Inability: Never true  . Transportation needs:    Medical: No    Non-medical: No  Tobacco Use  . Smoking status: Current Every Day Smoker  . Smokeless tobacco: Never Used  Substance and Sexual Activity  . Alcohol use: Not Currently  . Drug use: Not Currently  . Sexual  activity: Not Currently  Lifestyle  . Physical activity:    Days per week: 0 days    Minutes per session: 0 min  . Stress: Very much  Relationships  . Social connections:    Talks on phone: More than three times a week    Gets together: More than three times a week    Attends religious service: More than 4 times per year    Active member of club or organization: No    Attends meetings of clubs or organizations: Never    Relationship status: Widowed  Other Topics Concern  . Not on file  Social History Narrative  . Not on file    Additional Social History: Patient lives in [place].  She lives by herself.  She is widowed.  Her husband and her were married for 50 years before he passed away in Oct 28, 2013.  She has 2 adult son age 31 and 45.  Her 4 year old son has brain damage from a car accident.  She has 1 grandson with whom she is close.  She has a sister who lives close by who is  very supportive.  She is on SSI.  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  Allergies:   Allergies  Allergen Reactions  . Codeine Nausea Only  . Mirtazapine Other (See Comments)  . Sulfa Antibiotics Other (See Comments)  . Tramadol Other (See Comments)    Feels bad  . Benzodiazepines Anxiety    Metabolic Disorder Labs: No results found for: HGBA1C, MPG No results found for: PROLACTIN No results found for: CHOL, TRIG, HDL, CHOLHDL, VLDL, LDLCALC   Current Medications: Current Outpatient Medications  Medication Sig Dispense Refill  . alendronate (FOSAMAX) 70 MG tablet TAKE 1 TABLET BY MOUTH WEEKLY THE SAME DAY OF EACH WEEK. TAKE FIRST THING WITH A FULL GLASS OF WATER.    Marland Kitchen amLODipine (NORVASC) 5 MG tablet TAKE 1 TABLET BY MOUTH DAILY    . Calcium Carbonate-Vitamin D (CALCIUM HIGH POTENCY/VITAMIN D) 600-200 MG-UNIT TABS Take by mouth.    . fluticasone (FLONASE) 50 MCG/ACT nasal spray Place into the nose.    Marland Kitchen HYDROcodone-acetaminophen (NORCO/VICODIN) 5-325 MG tablet Take 1 tablet by  mouth every 6 (six) hours as needed for moderate pain.    . meloxicam (MOBIC) 7.5 MG tablet TAKE 1 TABLET BY MOUTH DAILY    . omeprazole (PRILOSEC) 20 MG capsule TAKE 1 CAPSULE BY MOUTH USUALLY 30 MINUTES BEFORE BREAKFAST    . simvastatin (ZOCOR) 40 MG tablet TAKE 1 TABLET BY MOUTH AT BEDTIME    . vitamin B-12 (CYANOCOBALAMIN) 500 MCG tablet Take by mouth.    Marland Kitchen FLUoxetine (PROZAC) 10 MG tablet Take 1 tablet (10 mg total) by mouth daily with breakfast. 30 tablet 1  . hydrOXYzine (ATARAX/VISTARIL) 10 MG tablet Take 1 tablet (10 mg total) by mouth 3 (three) times daily as needed. For anxiety 90 tablet 1  . traZODone (DESYREL) 50 MG tablet Take 0.5-1 tablets (25-50 mg total) by mouth at bedtime. 30 tablet 1   No current facility-administered medications for this visit.     Neurologic: Headache: No Seizure: No Paresthesias:No  Musculoskeletal: Strength & Muscle Tone: within normal limits Gait & Station: normal Patient leans: N/A  Psychiatric Specialty Exam: Review of Systems  Psychiatric/Behavioral: Positive for depression. The patient is nervous/anxious and has insomnia.   All other systems reviewed and are negative.   Blood pressure (!) 168/90, pulse (!) 102, temperature 98.3 F (36.8 C), temperature source Oral, height 5' 2.6" (1.59 m), weight 158 lb 6.4 oz (71.8 kg).Body mass index is 28.42 kg/m.  General Appearance: Casual  Eye Contact:  Fair  Speech:  Clear and Coherent  Volume:  Normal  Mood:  Anxious, Depressed and Dysphoric  Affect:  Appropriate  Thought Process:  Goal Directed and Descriptions of Associations: Intact  Orientation:  Full (Time, Place, and Person)  Thought Content:  Logical  Suicidal Thoughts:  No  Homicidal Thoughts:  No  Memory:  Immediate;   Fair Recent;   Fair Remote;   Fair  Judgement:  Fair  Insight:  Fair  Psychomotor Activity:  Normal  Concentration:  Concentration: Fair and Attention Span: Fair  Recall:  [employer] of Knowledge:Fair   Language: Fair  Akathisia:  No  Handed:  Right  AIMS (if indicated):  na  Assets:  Communication Skills Desire for Improvement Housing Social Support Talents/Skills Transportation Vocational/Educational  ADL's:  Intact  Cognition: WNL  Sleep:  poor    Treatment Plan Summary:Dhalia is a 72 year old Caucasian female who has a history of depression, anxiety as well as high blood pressure, hyperlipidemia,  GERD, arthritis, osteoporosis, assented to the clinic today to establish care.  Patient does have several psychosocial stressors including several deaths in her family recently.  Patient had multiple medication trials however reports she did not give those medications enough time .  She does have good social support from her sister who is here with her today.  Patient denies any substance abuse problems.  Patient denies any suicidality.  Patient is also motivated to start grief counseling at her church.  She reports they are starting a new group next month.  Discussed medication changes with patient.  Plan as noted below. Medication management and Plan as noted below  Plan  MDD Start Prozac 10 mg p.o. daily.  Given patient's past history of side effects to medications in general we will start low.  This was discussed with patient. Provided medication education, provided handouts. Start Vistaril 10 mg p.o. 3 times daily as needed for anxiety symptoms PHQ 9 equals 14  For GAD Prozac 10 mg p.o. daily Hydroxyzine 10 mg p.o. 3 times daily as needed GAD 7 equals 9  For insomnia Trazodone 25-50 mg p.o. nightly as needed  Patient to start grief counseling at her church.  Also discussed referral for CBT, she will let me know if she is willing to do that.  Provided supportive psychotherapy.  Follow-up in clinic in 2 weeks or sooner if needed.  More than 50 % of the time was spent for psychoeducation and supportive psychotherapy and care coordination.  This note was generated in part  or whole with voice recognition software. Voice recognition is usually quite accurate but there are transcription errors that can and very often do occur. I apologize for any typographical errors that were not detected and corrected.     Ursula Alert, MD 4/2/20193:46 PM

## 2017-10-08 ENCOUNTER — Telehealth: Payer: Self-pay

## 2017-10-08 NOTE — Telephone Encounter (Signed)
pt had called left message that the trazodone is not working. she is not able to sleep and she is nerves during the day. is there something else she can try.

## 2017-10-08 NOTE — Telephone Encounter (Signed)
Would recommend her to try 2 pills of trazodone to make it 100 mg or if that does not work , three pills  to make it 150 mg and call me back in few days if that does not work. If she needs more refills of trazodone , it can be send in if this works .   pls let her know.

## 2017-10-11 NOTE — Telephone Encounter (Signed)
pt was called and given instructions.

## 2017-10-13 ENCOUNTER — Telehealth: Payer: Self-pay

## 2017-10-13 DIAGNOSIS — F330 Major depressive disorder, recurrent, mild: Secondary | ICD-10-CM

## 2017-10-13 DIAGNOSIS — F411 Generalized anxiety disorder: Secondary | ICD-10-CM

## 2017-10-13 MED ORDER — FLUOXETINE HCL 20 MG PO CAPS: 20.0000 mg | ORAL_CAPSULE | Freq: Every day | ORAL | 1 refills | Status: DC

## 2017-10-13 NOTE — Telephone Encounter (Signed)
Will increase prozac to 20 mg. Please let pt know , prozac needs weeks to start being effective, Will see her on 51 th for appointment.

## 2017-10-13 NOTE — Telephone Encounter (Signed)
pt called states that her medication fluoxetine  is not working.  pt wants klonopin

## 2017-10-14 ENCOUNTER — Telehealth: Payer: Self-pay

## 2017-10-14 NOTE — Telephone Encounter (Signed)
As per discussion with patient on the day of evaluation, pt had already stopped taking Klonopin since it made her more anxious.

## 2017-10-14 NOTE — Telephone Encounter (Signed)
pharamcy called wanted to let you know that Kristen miller, md gave pt klonopin 1mg  and they wanted to make sure that you are aware of it because of the psy medications she takin

## 2017-10-15 NOTE — Telephone Encounter (Signed)
Well the pharmacy called and stated that dr Sabra Heck gave her a rx and that she picked it up yesterday.

## 2017-10-15 NOTE — Telephone Encounter (Signed)
Ok , will discuss with her when she comes in for appointment. Thanks.

## 2017-10-18 ENCOUNTER — Ambulatory Visit: Payer: PPO | Admitting: Psychiatry

## 2017-10-18 ENCOUNTER — Telehealth: Payer: Self-pay

## 2017-10-18 MED ORDER — DOXEPIN HCL 10 MG PO CAPS: 10.0000 mg | ORAL_CAPSULE | Freq: Every day | ORAL | 1 refills | Status: DC

## 2017-10-18 NOTE — Telephone Encounter (Signed)
pt called left a messages that the trazodone is not working.  pt wants to try something else.

## 2017-10-18 NOTE — Telephone Encounter (Signed)
Will sent doxepin for sleep to pharmacy. Please let her know to stop taking trazodone and start Doxepin for sleep

## 2017-10-21 ENCOUNTER — Other Ambulatory Visit: Payer: Self-pay

## 2017-10-21 ENCOUNTER — Ambulatory Visit (INDEPENDENT_AMBULATORY_CARE_PROVIDER_SITE_OTHER): Payer: PPO | Admitting: Psychiatry

## 2017-10-21 ENCOUNTER — Ambulatory Visit: Payer: PPO | Admitting: Psychiatry

## 2017-10-21 ENCOUNTER — Encounter: Payer: Self-pay | Admitting: Psychiatry

## 2017-10-21 VITALS — BP 143/82 | HR 102 | Temp 98.3°F | Wt 158.2 lb

## 2017-10-21 DIAGNOSIS — F5105 Insomnia due to other mental disorder: Secondary | ICD-10-CM

## 2017-10-21 DIAGNOSIS — F330 Major depressive disorder, recurrent, mild: Secondary | ICD-10-CM | POA: Diagnosis not present

## 2017-10-21 DIAGNOSIS — F411 Generalized anxiety disorder: Secondary | ICD-10-CM

## 2017-10-21 MED ORDER — FLUOXETINE HCL 10 MG PO TABS: 10.0000 mg | ORAL_TABLET | Freq: Every day | ORAL | 0 refills | Status: DC

## 2017-10-21 MED ORDER — BUSPIRONE HCL 10 MG PO TABS: 5.0000 mg | ORAL_TABLET | Freq: Three times a day (TID) | ORAL | 1 refills | Status: DC

## 2017-10-21 NOTE — Progress Notes (Signed)
Section MD OP Progress Note  10/21/2017 2:57 PM Kristen Potts  MRN:  [id number]  Chief Complaint: ' I am still anxious.' Chief Complaint    Follow-up; Medication Refill     HPI: Kristen Potts is a 72 year old Caucasian female, widowed, lives in [place], on [address], presented to the clinic today for a follow-up visit.  Patient has a history of depression, anxiety, GERD, glaucoma, arthritis, vitamin D deficiency, hypertension, hyperlipidemia and osteoporosis.   Patient today reports she continues to be anxious and wants medication changes.  Patient however reports she did not follow the instructions that we provided during her last visit.  She also did not pick up the medications that were called into her pharmacy over the past 1 week.  Patient reports she was not taking the Prozac as prescribed.  She has been skipping doses.  She reports it makes her drowsy during the day and that is why she did not take it as prescribed.  Discussed taking it with supper in the evening.  Patient seems to be preoccupied with having her Klonopin back.  Her Klonopin was previously prescribed by her primary medical doctor.  Patient reported to Probation officer during her first evaluation here on 10/05/2017 that all benzodiazepine medications that were tried in the past made her more anxious and hence she had to stop it.  Patient today reports she restarted the Klonopin couple of times the past 1 week and it made her less anxious.  Discussed with her the risk of being on benzodiazepine therapy including cognitive changes given her age.  Patient voiced understanding.  Discussed with patient to take the Prozac at lower dose of 10 mg however to take it with supper in the evening and to take it daily as instructed.  Also discussed adding BuSpar a small dose so she can feel less anxious during the day.  Patient reports hydroxyzine which was prescribed last visit as not effective.  Patient continues to have sleep issues.  She reports she took 3 of the  trazodone pills that were prescribed and still could not sleep.  Patient was provided a prescription of doxepin 3 days ago, which was called into her pharmacy.  Patient however reports she has not even filled her prescription yet.  Patient provided with verbal and written instructions.  Also discussed with patient that she needs to follow instructions and clinic policy.  Patient denies any suicidality.  Patient continues to have support system from her sister.  Visit Diagnosis:    ICD-10-CM   1. GAD (generalized anxiety disorder) F41.1   2. MDD (major depressive disorder), recurrent episode, mild ([place]) F33.0   3. Insomnia due to mental condition F51.05     Past Psychiatric History: Hx of depression and anxiety, was being managed by PMD since the past 2 years.  Patient denies any inpatient mental health admissions.  Patient denies any history of suicide.  Past trials of Celexa, Zoloft, Wellbutrin, Cymbalta, Rexulti, lorazepam, clorazepate, temazepam, Zyprexa, Abilify, mirtazapine, Klonopin, Xanax.  Past Medical History:  Past Medical History:  Diagnosis Date  . Anxiety   . Depression     Past Surgical History:  Procedure Laterality Date  . CATARACT EXTRACTION    . TONSILLECTOMY      Family Psychiatric History: Brother-alcohol abuse, drug abuse.  Sister-depression  Family History:  Family History  Problem Relation Age of Onset  . Alcohol abuse Brother   . Drug abuse Brother   . Breast cancer Neg Hx     Social History: Lives  in [place].  She lives by herself.  She is widowed.  Her husband and her were married for 50 years before he passed away in 2013/10/30.  She has 2 adult son aged 50 and 30.  Her 36 year old son has brain damage from a car accident.  She has 1 grandson with whom she is close.  She has a sister who lives close by who is very supportive.  She is on SSI.  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 Social History   Socioeconomic History  . Marital  status: Widowed    Spouse name: Not on file  . Number of children: 2  . Years of education: Not on file  . Highest education level: 11th grade  Occupational History    Comment: retired  Scientific laboratory technician  . Financial resource strain: Not hard at all  . Food insecurity:    Worry: Never true    Inability: Never true  . Transportation needs:    Medical: No    Non-medical: No  Tobacco Use  . Smoking status: Current Every Day Smoker  . Smokeless tobacco: Never Used  Substance and Sexual Activity  . Alcohol use: Not Currently  . Drug use: Not Currently  . Sexual activity: Not Currently  Lifestyle  . Physical activity:    Days per week: 0 days    Minutes per session: 0 min  . Stress: Very much  Relationships  . Social connections:    Talks on phone: More than three times a week    Gets together: More than three times a week    Attends religious service: More than 4 times per year    Active member of club or organization: No    Attends meetings of clubs or organizations: Never    Relationship status: Widowed  Other Topics Concern  . Not on file  Social History Narrative  . Not on file    Allergies:  Allergies  Allergen Reactions  . Codeine Nausea Only  . Mirtazapine Other (See Comments)  . Sulfa Antibiotics Other (See Comments)  . Tramadol Other (See Comments)    Feels bad  . Benzodiazepines Anxiety    Metabolic Disorder Labs: No results found for: HGBA1C, MPG No results found for: PROLACTIN No results found for: CHOL, TRIG, HDL, CHOLHDL, VLDL, LDLCALC No results found for: TSH  Therapeutic Level Labs: No results found for: LITHIUM No results found for: VALPROATE No components found for:  CBMZ  Current Medications: Current Outpatient Medications  Medication Sig Dispense Refill  . alendronate (FOSAMAX) 70 MG tablet TAKE 1 TABLET BY MOUTH WEEKLY THE SAME DAY OF EACH WEEK. TAKE FIRST THING WITH A FULL GLASS OF WATER.    Marland Kitchen amLODipine (NORVASC) 5 MG tablet TAKE 1  TABLET BY MOUTH DAILY    . busPIRone (BUSPAR) 10 MG tablet Take 0.5 tablets (5 mg total) by mouth 3 (three) times daily. 45 tablet 1  . Calcium Carbonate-Vitamin D (CALCIUM HIGH POTENCY/VITAMIN D) 600-200 MG-UNIT TABS Take by mouth.    . doxepin (SINEQUAN) 10 MG capsule Take 1 capsule (10 mg total) by mouth at bedtime. For sleep 30 capsule 1  . FLUoxetine (PROZAC) 10 MG tablet Take 1 tablet (10 mg total) by mouth daily. 30 tablet 0  . fluticasone (FLONASE) 50 MCG/ACT nasal spray Place into the nose.    Marland Kitchen HYDROcodone-acetaminophen (NORCO/VICODIN) 5-325 MG tablet Take 1 tablet by mouth every 6 (six) hours as needed for moderate pain.    . meloxicam (MOBIC)  7.5 MG tablet TAKE 1 TABLET BY MOUTH DAILY    . omeprazole (PRILOSEC) 20 MG capsule TAKE 1 CAPSULE BY MOUTH USUALLY 30 MINUTES BEFORE BREAKFAST    . simvastatin (ZOCOR) 40 MG tablet TAKE 1 TABLET BY MOUTH AT BEDTIME    . vitamin B-12 (CYANOCOBALAMIN) 500 MCG tablet Take by mouth.     No current facility-administered medications for this visit.      Musculoskeletal: Strength & Muscle Tone: within normal limits Gait & Station: normal Patient leans: N/A  Psychiatric Specialty Exam: Review of Systems  Psychiatric/Behavioral: The patient is nervous/anxious and has insomnia.   All other systems reviewed and are negative.   Blood pressure (!) 143/82, pulse (!) 102, temperature 98.3 F (36.8 C), temperature source Oral, weight 158 lb 3.2 oz (71.8 kg).Body mass index is 28.38 kg/m.  General Appearance: Casual  Eye Contact:  Fair  Speech:  Clear and Coherent  Volume:  Normal  Mood:  Anxious  Affect:  Congruent  Thought Process:  Goal Directed and Descriptions of Associations: Intact  Orientation:  Full (Time, Place, and Person)  Thought Content: Logical   Suicidal Thoughts:  No  Homicidal Thoughts:  No  Memory:  Immediate;   Fair Recent;   Fair Remote;   Fair  Judgement:  Fair  Insight:  Fair  Psychomotor Activity:  Normal   Concentration:  Concentration: Fair and Attention Span: Fair  Recall:  [employer] of Knowledge: Fair  Language: Fair  Akathisia:  No  Handed:  Right  AIMS (if indicated): na  Assets:  Communication Skills Desire for Improvement Housing Social Support  ADL's:  Intact  Cognition: WNL  Sleep:  Poor   Screenings:   Assessment and Plan: Alois is a 72 year old Caucasian female who has a history of depression, anxiety, high blood pressure, hyperlipidemia, GERD, arthritis, osteoporosis, presented to the clinic today for a follow-up visit.  Continues to struggle with depression, anxiety as well as sleep issues.  She did not follow any instructions that were provided to her last visit.  Patient seemed to be preoccupied with Klonopin prescription today.  Patient being prescribed Klonopin by PMD.  Discussed medication changes with patient.  Plan as noted below.  Plan MDD Continue Prozac 10 mg p.o. daily.  Discussed with patient to continue taking Prozac as prescribed and to take it in the evening if it makes her drowsy during the day. Discontinued hydroxyzine for lack of efficacy. Start BuSpar 5 mg p.o. 3 times daily  For GAD Prozac 10 mg p.o. daily Add BuSpar 5 mg p.o. 3 times daily  For insomnia Discontinue trazodone for lack of efficacy Patient provided doxepin 10 mg p.o. nightly 3 days ago.  Patient has not picked up her prescription yet.  Pt provided with verbal and written instructions to continue medications as prescribed.  Discussed with patient the side effects of medications like Klonopin including cognitive issues, falls, confusion and addictive potential.  Also discussed with patient to take the Prozac daily to get the effect from the medication.  Follow up in clinic in 4 weeks or sooner if needed.  More than 50 % of the time was spent for psychoeducation and supportive psychotherapy and care coordination.  This note was generated in part or whole with voice recognition  software. Voice recognition is usually quite accurate but there are transcription errors that can and very often do occur. I apologize for any typographical errors that were not detected and corrected.  Ursula Alert, MD 10/22/2017, 5:21 AM

## 2017-10-21 NOTE — Patient Instructions (Addendum)
Please continue taking Prozac or fluoxetine 10 mg daily.  Please do not miss any dose since it has to build up in your system.  Please take the fluoxetine with supper in the evening if it makes you too drowsy when you take it in the morning.  Your new antianxiety medication is BuSpar or buspirone.  It comes in a 10 mg tablet, please cut it into half and take 5 mg 3 times a day.  If it makes you too drowsy you can take it 2 times a day instead of 3 times a day.  You new sleep medication doxepin 10 mg has been sent to pharmacy on 10/18/2017.  Please pick it up at your pharmacy.  Please wean yourself off of the Klonopin.  It is currently being prescribed by your PMD.  Please stop taking the trazodone and the hydroxyzine or Vistaril which you say are not working for you.

## 2017-10-22 ENCOUNTER — Encounter: Payer: Self-pay | Admitting: Psychiatry

## 2017-10-25 ENCOUNTER — Telehealth: Payer: Self-pay

## 2017-10-25 NOTE — Telephone Encounter (Signed)
faxed and confirm notice of approval for the doxepin

## 2017-10-25 NOTE — Telephone Encounter (Signed)
received fax notice that doxepin 10mg  capsule has been approved from 10-21-17 to  07-05-18

## 2017-10-26 ENCOUNTER — Ambulatory Visit: Admission: RE | Admit: 2017-10-26 | Payer: PPO | Source: Ambulatory Visit | Admitting: Unknown Physician Specialty

## 2017-10-26 ENCOUNTER — Encounter: Admission: RE | Payer: Self-pay | Source: Ambulatory Visit

## 2017-10-26 SURGERY — COLONOSCOPY WITH PROPOFOL
Anesthesia: General

## 2017-11-10 ENCOUNTER — Encounter: Payer: Self-pay | Admitting: *Deleted

## 2017-11-10 ENCOUNTER — Ambulatory Visit: Payer: PPO | Admitting: Anesthesiology

## 2017-11-10 ENCOUNTER — Ambulatory Visit
Admission: RE | Admit: 2017-11-10 | Discharge: 2017-11-10 | Disposition: A | Discharge status: Home / Self Care | Payer: PPO | Source: Ambulatory Visit | Attending: Unknown Physician Specialty | Admitting: Unknown Physician Specialty

## 2017-11-10 ENCOUNTER — Encounter
Admission: RE | Disposition: A | Discharge status: Home / Self Care | Payer: Self-pay | Source: Ambulatory Visit | Attending: Unknown Physician Specialty

## 2017-11-10 DIAGNOSIS — M1990 Unspecified osteoarthritis, unspecified site: Secondary | ICD-10-CM | POA: Diagnosis not present

## 2017-11-10 DIAGNOSIS — Z882 Allergy status to sulfonamides status: Secondary | ICD-10-CM | POA: Diagnosis not present

## 2017-11-10 DIAGNOSIS — J449 Chronic obstructive pulmonary disease, unspecified: Secondary | ICD-10-CM | POA: Insufficient documentation

## 2017-11-10 DIAGNOSIS — F419 Anxiety disorder, unspecified: Secondary | ICD-10-CM | POA: Diagnosis not present

## 2017-11-10 DIAGNOSIS — K5730 Diverticulosis of large intestine without perforation or abscess without bleeding: Secondary | ICD-10-CM | POA: Diagnosis not present

## 2017-11-10 DIAGNOSIS — K5289 Other specified noninfective gastroenteritis and colitis: Secondary | ICD-10-CM | POA: Diagnosis not present

## 2017-11-10 DIAGNOSIS — K635 Polyp of colon: Secondary | ICD-10-CM | POA: Diagnosis not present

## 2017-11-10 DIAGNOSIS — E785 Hyperlipidemia, unspecified: Secondary | ICD-10-CM | POA: Insufficient documentation

## 2017-11-10 DIAGNOSIS — K640 First degree hemorrhoids: Secondary | ICD-10-CM | POA: Diagnosis not present

## 2017-11-10 DIAGNOSIS — Z885 Allergy status to narcotic agent status: Secondary | ICD-10-CM | POA: Insufficient documentation

## 2017-11-10 DIAGNOSIS — K648 Other hemorrhoids: Secondary | ICD-10-CM | POA: Diagnosis not present

## 2017-11-10 DIAGNOSIS — Z86010 Personal history of colonic polyps: Secondary | ICD-10-CM | POA: Diagnosis not present

## 2017-11-10 DIAGNOSIS — K219 Gastro-esophageal reflux disease without esophagitis: Secondary | ICD-10-CM | POA: Insufficient documentation

## 2017-11-10 DIAGNOSIS — Z1211 Encounter for screening for malignant neoplasm of colon: Secondary | ICD-10-CM | POA: Insufficient documentation

## 2017-11-10 DIAGNOSIS — K5790 Diverticulosis of intestine, part unspecified, without perforation or abscess without bleeding: Secondary | ICD-10-CM | POA: Diagnosis not present

## 2017-11-10 DIAGNOSIS — Z888 Allergy status to other drugs, medicaments and biological substances status: Secondary | ICD-10-CM | POA: Insufficient documentation

## 2017-11-10 DIAGNOSIS — F329 Major depressive disorder, single episode, unspecified: Secondary | ICD-10-CM | POA: Insufficient documentation

## 2017-11-10 DIAGNOSIS — Z87891 Personal history of nicotine dependence: Secondary | ICD-10-CM | POA: Diagnosis not present

## 2017-11-10 DIAGNOSIS — K529 Noninfective gastroenteritis and colitis, unspecified: Secondary | ICD-10-CM | POA: Insufficient documentation

## 2017-11-10 DIAGNOSIS — D125 Benign neoplasm of sigmoid colon: Secondary | ICD-10-CM | POA: Diagnosis not present

## 2017-11-10 DIAGNOSIS — Z79899 Other long term (current) drug therapy: Secondary | ICD-10-CM | POA: Diagnosis not present

## 2017-11-10 DIAGNOSIS — F418 Other specified anxiety disorders: Secondary | ICD-10-CM | POA: Diagnosis not present

## 2017-11-10 HISTORY — DX: Unspecified glaucoma: H40.9

## 2017-11-10 HISTORY — DX: Unspecified osteoarthritis, unspecified site: M19.90

## 2017-11-10 HISTORY — DX: Gastro-esophageal reflux disease without esophagitis: K21.9

## 2017-11-10 HISTORY — DX: Polyp of vocal cord and larynx: J38.1

## 2017-11-10 HISTORY — DX: Hyperlipidemia, unspecified: E78.5

## 2017-11-10 HISTORY — DX: Chronic obstructive pulmonary disease, unspecified: J44.9

## 2017-11-10 HISTORY — PX: COLONOSCOPY WITH PROPOFOL: SHX5780

## 2017-11-10 SURGERY — COLONOSCOPY WITH PROPOFOL
Anesthesia: General

## 2017-11-10 MED ORDER — PROPOFOL 10 MG/ML IV BOLUS
INTRAVENOUS | Status: AC
  Filled 2017-11-10: qty 20

## 2017-11-10 MED ORDER — SODIUM CHLORIDE 0.9 % IV SOLN: INTRAVENOUS | Status: DC

## 2017-11-10 MED ORDER — LIDOCAINE 2% (20 MG/ML) 5 ML SYRINGE
INTRAMUSCULAR | Status: DC | PRN
  Administered 2017-11-10: 30 mg via INTRAVENOUS

## 2017-11-10 MED ORDER — FENTANYL CITRATE (PF) 100 MCG/2ML IJ SOLN
INTRAMUSCULAR | Status: AC
  Filled 2017-11-10: qty 2

## 2017-11-10 MED ORDER — SODIUM CHLORIDE 0.9 % IV SOLN
INTRAVENOUS | Status: DC
  Administered 2017-11-10: 13:00:00 via INTRAVENOUS

## 2017-11-10 MED ORDER — FENTANYL CITRATE (PF) 100 MCG/2ML IJ SOLN
INTRAMUSCULAR | Status: DC | PRN
  Administered 2017-11-10 (×2): 50 ug via INTRAVENOUS

## 2017-11-10 MED ORDER — PROPOFOL 500 MG/50ML IV EMUL
INTRAVENOUS | Status: DC | PRN
  Administered 2017-11-10: 140 ug/kg/min via INTRAVENOUS

## 2017-11-10 MED ORDER — PROPOFOL 500 MG/50ML IV EMUL
INTRAVENOUS | Status: AC
  Filled 2017-11-10: qty 50

## 2017-11-10 MED ORDER — LIDOCAINE HCL (PF) 2 % IJ SOLN
INTRAMUSCULAR | Status: AC
  Filled 2017-11-10: qty 10

## 2017-11-10 MED ORDER — PROPOFOL 10 MG/ML IV BOLUS
INTRAVENOUS | Status: DC | PRN
  Administered 2017-11-10: 100 mg via INTRAVENOUS

## 2017-11-10 NOTE — Op Note (Signed)
[facility] Patient Name: Kristen Potts Procedure Date: 11/10/2017 1:03 PM MRN: [id number] Account #: [id number] Date of Birth: 1946-02-01 Admit Type: Outpatient Age: 72 Room: [facility] ENDO ROOM 3 Gender: Female Note Status: Finalized Procedure:            Colonoscopy Indications:          High risk colon cancer surveillance: Personal history                        of colonic polyps Providers:            Manya Silvas, MD Referring MD:         Rusty Aus, MD (Referring MD) Medicines:            Propofol per Anesthesia Complications:        No immediate complications. Procedure:            Pre-Anesthesia Assessment:                       - After reviewing the risks and benefits, the patient                        was deemed in satisfactory condition to undergo the                        procedure.                       After obtaining informed consent, the colonoscope was                        passed under direct vision. Throughout the procedure,                        the patient's blood pressure, pulse, and oxygen                        saturations were monitored continuously. The                        Colonoscope was introduced through the anus and                        advanced to the the cecum, identified by appendiceal                        orifice and ileocecal valve. The colonoscopy was                        performed without difficulty. The patient tolerated the                        procedure well. The quality of the bowel preparation                        was good. Findings:      A diminutive polyp was found in the cecum. The polyp was sessile. The       polyp was removed with a jumbo cold forceps. Resection and retrieval       were complete.      A medium polyp was found  in the proximal sigmoid colon. The polyp was       sessile. The polyp was removed with a hot snare. Resection and retrieval       were complete. To prevent  bleeding after the polypectomy, two hemostatic       clips were successfully placed. There was no bleeding during, or at the       end, of the procedure.      Multiple small-mouthed diverticula were found in the sigmoid colon,       descending colon and transverse colon.      Internal hemorrhoids were found during endoscopy. The hemorrhoids were       small and Grade I (internal hemorrhoids that do not prolapse).      The exam was otherwise without abnormality. Impression:           - One diminutive polyp in the cecum, removed with a                        jumbo cold forceps. Resected and retrieved.                       - One medium polyp in the proximal sigmoid colon,                        removed with a hot snare. Resected and retrieved. Clips                        were placed.                       - Diverticulosis in the sigmoid colon, in the                        descending colon and in the transverse colon.                       - The examination was otherwise normal.                       - Internal hemorrhoids. Recommendation:       - Await pathology results. Manya Silvas, MD 11/10/2017 1:51:13 PM This report has been signed electronically. Number of Addenda: 0 Note Initiated On: 11/10/2017 1:03 PM Scope Withdrawal Time: 0 hours 19 minutes 44 seconds  Total Procedure Duration: 0 hours 29 minutes 5 seconds       [facility]

## 2017-11-10 NOTE — Anesthesia Post-op Follow-up Note (Signed)
Anesthesia QCDR form completed.        

## 2017-11-10 NOTE — Anesthesia Preprocedure Evaluation (Signed)
Anesthesia Evaluation  Patient identified by MRN, date of birth, ID band Patient awake    Reviewed: Allergy & Precautions, H&P , NPO status , Patient's Chart, lab work & pertinent test results, reviewed documented beta blocker date and time   Airway Mallampati: II   Neck ROM: full    Dental  (+) Poor Dentition   Pulmonary neg pulmonary ROS, COPD, former smoker,    Pulmonary exam normal        Cardiovascular Exercise Tolerance: Poor negative cardio ROS Normal cardiovascular exam Rhythm:regular Rate:Normal     Neuro/Psych PSYCHIATRIC DISORDERS Anxiety Depression negative neurological ROS  negative psych ROS   GI/Hepatic negative GI ROS, Neg liver ROS, GERD  Medicated,  Endo/Other  negative endocrine ROS  Renal/GU negative Renal ROS  negative genitourinary   Musculoskeletal   Abdominal   Peds  Hematology negative hematology ROS (+)   Anesthesia Other Findings Past Medical History: No date: Anxiety No date: Arthritis No date: COPD (chronic obstructive pulmonary disease) (HCC) No date: Depression No date: GERD (gastroesophageal reflux disease) No date: Glaucoma No date: Hyperlipidemia No date: Vocal cord polyp Past Surgical History: No date: CATARACT EXTRACTION 11/08/2006: COLONOSCOPY 04/06/2014: COLONOSCOPY W/ POLYPECTOMY     Comment:  adenomatous polyps, family history of colon polyps No date: TONSILLECTOMY     Comment:  and adenoidectomy 2012: vocal cord polypectomy BMI    Body Mass Index:  28.90 kg/m     Reproductive/Obstetrics negative OB ROS                             Anesthesia Physical Anesthesia Plan  ASA: III  Anesthesia Plan: General   Post-op Pain Management:    Induction:   PONV Risk Score and Plan:   Airway Management Planned:   Additional Equipment:   Intra-op Plan:   Post-operative Plan:   Informed Consent: I have reviewed the patients History  and Physical, chart, labs and discussed the procedure including the risks, benefits and alternatives for the proposed anesthesia with the patient or authorized representative who has indicated his/her understanding and acceptance.   Dental Advisory Given  Plan Discussed with: CRNA  Anesthesia Plan Comments:         Anesthesia Quick Evaluation

## 2017-11-10 NOTE — Transfer of Care (Signed)
Immediate Anesthesia Transfer of Care Note  Patient: Kristen Potts  Procedure(s) Performed: COLONOSCOPY WITH PROPOFOL (N/A )  Patient Location: PACU and Endoscopy Unit  Anesthesia Type:General  Level of Consciousness: awake, drowsy and patient cooperative  Airway & Oxygen Therapy: Patient Spontanous Breathing and Patient connected to nasal cannula oxygen  Post-op Assessment: Report given to RN and Post -op Vital signs reviewed and stable  Post vital signs: Reviewed and stable  Last Vitals:  Vitals Value Taken Time  BP    Temp    Pulse 81 11/10/2017  1:51 PM  Resp 15 11/10/2017  1:51 PM  SpO2 100 % 11/10/2017  1:51 PM  Vitals shown include unvalidated device data.  Last Pain:  Vitals:   11/10/17 1254  TempSrc: Tympanic         Complications: No apparent anesthesia complications

## 2017-11-10 NOTE — H&P (Signed)
Primary Care Physician:  Rusty Aus, MD Primary Gastroenterologist:  Dr. Vira Agar  Pre-Procedure History & Physical: HPI:  Kristen Potts is a 72 y.o. female is here for an colonoscopy.  Indication is for [facility] colon polyps.   Past Medical History:  Diagnosis Date  . Anxiety   . Arthritis   . COPD (chronic obstructive pulmonary disease) ([place])   . Depression   . GERD (gastroesophageal reflux disease)   . Glaucoma   . Hyperlipidemia   . Vocal cord polyp     Past Surgical History:  Procedure Laterality Date  . CATARACT EXTRACTION    . COLONOSCOPY  11/08/2006  . COLONOSCOPY W/ POLYPECTOMY  04/06/2014   adenomatous polyps, family history of colon polyps  . TONSILLECTOMY     and adenoidectomy  . vocal cord polypectomy  2012    Prior to Admission medications   Medication Sig Start Date End Date Taking? Authorizing Provider  alendronate (FOSAMAX) 70 MG tablet TAKE 1 TABLET BY MOUTH WEEKLY THE SAME DAY OF EACH WEEK. TAKE FIRST THING WITH A FULL GLASS OF WATER. 10/09/16   [provider]  amLODipine (NORVASC) 5 MG tablet TAKE 1 TABLET BY MOUTH DAILY 04/05/17   [provider]  busPIRone (BUSPAR) 10 MG tablet Take 0.5 tablets (5 mg total) by mouth 3 (three) times daily. 10/21/17   Ursula Alert, MD  Calcium Carbonate-Vitamin D (CALCIUM HIGH POTENCY/VITAMIN D) 600-200 MG-UNIT TABS Take by mouth.    [provider]  doxepin (SINEQUAN) 10 MG capsule Take 1 capsule (10 mg total) by mouth at bedtime. For sleep 10/18/17   Ursula Alert, MD  FLUoxetine (PROZAC) 10 MG tablet Take 1 tablet (10 mg total) by mouth daily. 10/21/17   Ursula Alert, MD  fluticasone (FLONASE) 50 MCG/ACT nasal spray Place into the nose. 07/20/17   [provider]  HYDROcodone-acetaminophen (NORCO/VICODIN) 5-325 MG tablet Take 1 tablet by mouth every 6 (six) hours as needed for moderate pain.    [provider]  meloxicam (MOBIC) 7.5 MG tablet TAKE 1 TABLET BY MOUTH DAILY  06/30/17   [provider]  omeprazole (PRILOSEC) 20 MG capsule TAKE 1 CAPSULE BY MOUTH USUALLY 30 MINUTES BEFORE BREAKFAST 02/26/17   [provider]  simvastatin (ZOCOR) 40 MG tablet TAKE 1 TABLET BY MOUTH AT BEDTIME 07/22/17   [provider]  vitamin B-12 (CYANOCOBALAMIN) 500 MCG tablet Take by mouth.    [provider]    Allergies as of 10/27/2017 - Review Complete 10/22/2017  Allergen Reaction Noted  . Codeine Nausea Only 03/19/2014  . Mirtazapine Other (See Comments) 02/18/2016  . Sulfa antibiotics Other (See Comments) 03/19/2014  . Tramadol Other (See Comments) 03/19/2014  . Benzodiazepines Anxiety 08/16/2017    Family History  Problem Relation Age of Onset  . Alcohol abuse Brother   . Drug abuse Brother   . Breast cancer Neg Hx     Social History   Socioeconomic History  . Marital status: Widowed    Spouse name: Not on file  . Number of children: 2  . Years of education: Not on file  . Highest education level: 11th grade  Occupational History    Comment: retired  Scientific laboratory technician  . Financial resource strain: Not hard at all  . Food insecurity:    Worry: Never true    Inability: Never true  . Transportation needs:    Medical: No    Non-medical: No  Tobacco Use  . Smoking status: Former Smoker  Types: E-cigarettes    Last attempt to quit: 05/06/2013    Years since quitting: 4.5  . Smokeless tobacco: Never Used  Substance and Sexual Activity  . Alcohol use: Not Currently  . Drug use: Not Currently  . Sexual activity: Not Currently  Lifestyle  . Physical activity:    Days per week: 0 days    Minutes per session: 0 min  . Stress: Very much  Relationships  . Social connections:    Talks on phone: More than three times a week    Gets together: More than three times a week    Attends religious service: More than 4 times per year    Active member of club or organization: No    Attends meetings of clubs or organizations:  Never    Relationship status: Widowed  . Intimate partner violence:    Fear of current or ex partner: No    Emotionally abused: No    Physically abused: No    Forced sexual activity: No  Other Topics Concern  . Not on file  Social History Narrative  . Not on file    Review of Systems: See HPI, otherwise negative ROS  Physical Exam: BP (!) 155/99   Pulse 98   Temp (!) 96.2 F (35.7 C) (Tympanic)   Resp 18   Ht 5\' 2"  (1.575 m)   Wt 71.7 kg (158 lb)   SpO2 98%   BMI 28.90 kg/m  General:   Alert,  pleasant and cooperative in NAD Head:  Normocephalic and atraumatic. Neck:  Supple; no masses or thyromegaly. Lungs:  Clear throughout to auscultation.    Heart:  Regular rate and rhythm. Abdomen:  Soft, nontender and nondistended. Normal bowel sounds, without guarding, and without rebound.   Neurologic:  Alert and  oriented x4;  grossly normal neurologically.  Impression/Plan: Kristen Potts is here for an colonoscopy to be performed for [facility] colon polyps.  Risks, benefits, limitations, and alternatives regarding  colonoscopy have been reviewed with the patient.  Questions have been answered.  All parties agreeable.   Gaylyn Cheers, MD  11/10/2017, 1:11 PM

## 2017-11-11 ENCOUNTER — Encounter: Payer: Self-pay | Admitting: Unknown Physician Specialty

## 2017-11-11 NOTE — Anesthesia Postprocedure Evaluation (Signed)
Anesthesia Post Note  Patient: Kristen Potts  Procedure(s) Performed: COLONOSCOPY WITH PROPOFOL (N/A )  Patient location during evaluation: PACU Anesthesia Type: General Level of consciousness: awake and alert Pain management: pain level controlled Vital Signs Assessment: post-procedure vital signs reviewed and stable Respiratory status: spontaneous breathing, nonlabored ventilation, respiratory function stable and patient connected to nasal cannula oxygen Cardiovascular status: blood pressure returned to baseline and stable Postop Assessment: no apparent nausea or vomiting Anesthetic complications: no     Last Vitals:  Vitals:   11/10/17 1412 11/10/17 1422  BP: (!) 155/88 (!) 164/91  Pulse: 77 84  Resp: 17 16  Temp:    SpO2: 99% 99%    Last Pain:  Vitals:   11/11/17 0808  TempSrc:   PainSc: 0-No pain                 Molli Barrows

## 2017-11-12 LAB — SURGICAL PATHOLOGY

## 2017-11-18 ENCOUNTER — Encounter: Payer: Self-pay | Admitting: Psychiatry

## 2017-11-18 ENCOUNTER — Other Ambulatory Visit: Payer: Self-pay

## 2017-11-18 ENCOUNTER — Ambulatory Visit (INDEPENDENT_AMBULATORY_CARE_PROVIDER_SITE_OTHER): Payer: PPO | Admitting: Psychiatry

## 2017-11-18 VITALS — BP 136/80 | HR 109 | Temp 98.3°F | Wt 159.2 lb

## 2017-11-18 DIAGNOSIS — F330 Major depressive disorder, recurrent, mild: Secondary | ICD-10-CM

## 2017-11-18 DIAGNOSIS — F411 Generalized anxiety disorder: Secondary | ICD-10-CM

## 2017-11-18 MED ORDER — BUSPIRONE HCL 10 MG PO TABS: 10.0000 mg | ORAL_TABLET | Freq: Two times a day (BID) | ORAL | 1 refills | Status: DC

## 2017-11-18 NOTE — Progress Notes (Signed)
[place] MD OP Progress Note  11/18/2017 3:02 PM Kristen Potts  MRN:  [id number]  Chief Complaint: ' I am here for follow up.' Chief Complaint    Follow-up; Medication Refill     HPI: Kristen Potts is a 72 year old Caucasian female, widowed, lives in [place], [place], presented to the clinic today for a follow-up visit.  Patient has a history of depression, anxiety, GERD, glaucoma, arthritis, vitamin D deficiency, hypertension, hyperlipidemia and osteoporosis.   Patient today reports she continues to feel tired, anhedonia has lack of motivation and feels sad.  She reports she feels the Prozac makes her tired during the day.  Patient reports she started taking doxepin few days ago for sleep.  She reports it helps her to some extent.  She has a history of being tried on medications like Abilify, Zoloft, Wellbutrin, Remeron, Zyprexa, Cymbalta, Celexa,Rexulti, in the past.  Patient has a history of staying on the medication just for a few days and then stop taking it.  Patient has a history of not giving medication time to work.  Patient today seems to be unhappy about her Prozac.  Discussed with patient to take the Prozac in the evening.  Discussed with her that the BuSpar dose can be increased during the day since she is complaining of feeling anxious and jittery during the day.  Discussed with patient to try these medication changes and return to clinic in 3-4 weeks.  Patient agrees with plan.  Patient reports she has started psychotherapy/counseling at her church.  She reports its weekly for the next 6-8 weeks.  She reports a good benefit from the same.  Visit Diagnosis:    ICD-10-CM   1. GAD (generalized anxiety disorder) F41.1   2. MDD (major depressive disorder), recurrent episode, mild ([place]) F33.0     Past Psychiatric History: Have reviewed past psychiatric history from my progress note on 10/05/2017.  Past trials of Celexa, Zoloft, Wellbutrin, Cymbalta, lorazepam, clorazepate, temazepam, Zyprexa,  Abilify, mirtazapine, Klonopin, Xanax,rexulti-she has a history of staying on medication orally for a few days and not giving them enough time.  Past Medical History:  Past Medical History:  Diagnosis Date  . Anxiety   . Arthritis   . COPD (chronic obstructive pulmonary disease) ([place])   . Depression   . GERD (gastroesophageal reflux disease)   . Glaucoma   . Hyperlipidemia   . Vocal cord polyp     Past Surgical History:  Procedure Laterality Date  . CATARACT EXTRACTION    . COLONOSCOPY  11/08/2006  . COLONOSCOPY W/ POLYPECTOMY  04/06/2014   adenomatous polyps, family history of colon polyps  . COLONOSCOPY WITH PROPOFOL N/A 11/10/2017   Procedure: COLONOSCOPY WITH PROPOFOL;  Surgeon: Manya Silvas, MD;  Location: [facility] ENDOSCOPY;  Service: Endoscopy;  Laterality: N/A;  . TONSILLECTOMY     and adenoidectomy  . vocal cord polypectomy  21-Oct-2010    Family Psychiatric History: I have reviewed family psychiatric history from my progress note on 10/05/2017  Family History:  Family History  Problem Relation Age of Onset  . Alcohol abuse Brother   . Drug abuse Brother   . Breast cancer Neg Hx     Social History: Patient lives in [place].  She lives by herself.  She is widowed.  Her husband and her were married for 50 years before he passed away in 10-20-13.  She has 2 adult sons aged 52 and 52.  Her 34 year old son has brain damage from a car accident.  She  has 1 grandson with whom she is close.  She has a sister who lives close by who is very supportive.  She is on SSI.  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 Social History   Socioeconomic History  . Marital status: Widowed    Spouse name: Not on file  . Number of children: 2  . Years of education: Not on file  . Highest education level: 11th grade  Occupational History    Comment: retired  Scientific laboratory technician  . Financial resource strain: Not hard at all  . Food insecurity:    Worry: Never true    Inability: Never  true  . Transportation needs:    Medical: No    Non-medical: No  Tobacco Use  . Smoking status: Current Every Day Smoker    Types: E-cigarettes, Cigarettes  . Smokeless tobacco: Never Used  Substance and Sexual Activity  . Alcohol use: Not Currently  . Drug use: Not Currently  . Sexual activity: Not Currently  Lifestyle  . Physical activity:    Days per week: 0 days    Minutes per session: 0 min  . Stress: Very much  Relationships  . Social connections:    Talks on phone: More than three times a week    Gets together: More than three times a week    Attends religious service: More than 4 times per year    Active member of club or organization: No    Attends meetings of clubs or organizations: Never    Relationship status: Widowed  Other Topics Concern  . Not on file  Social History Narrative  . Not on file    Allergies:  Allergies  Allergen Reactions  . Codeine Nausea Only  . Mirtazapine Other (See Comments)  . Sulfa Antibiotics Other (See Comments)  . Tramadol Other (See Comments)    Feels bad  . Benzodiazepines Anxiety    Metabolic Disorder Labs: No results found for: HGBA1C, MPG No results found for: PROLACTIN No results found for: CHOL, TRIG, HDL, CHOLHDL, VLDL, LDLCALC No results found for: TSH  Therapeutic Level Labs: No results found for: LITHIUM No results found for: VALPROATE No components found for:  CBMZ  Current Medications: Current Outpatient Medications  Medication Sig Dispense Refill  . alendronate (FOSAMAX) 70 MG tablet TAKE 1 TABLET BY MOUTH WEEKLY THE SAME DAY OF EACH WEEK. TAKE FIRST THING WITH A FULL GLASS OF WATER.    Marland Kitchen amLODipine (NORVASC) 5 MG tablet TAKE 1 TABLET BY MOUTH DAILY    . busPIRone (BUSPAR) 10 MG tablet Take 1 tablet (10 mg total) by mouth 2 (two) times daily. 60 tablet 1  . Calcium Carbonate-Vitamin D (CALCIUM HIGH POTENCY/VITAMIN D) 600-200 MG-UNIT TABS Take by mouth.    . doxepin (SINEQUAN) 10 MG capsule Take 1 capsule  (10 mg total) by mouth at bedtime. For sleep 30 capsule 1  . FLUoxetine (PROZAC) 10 MG tablet Take 1 tablet (10 mg total) by mouth daily. 30 tablet 0  . fluticasone (FLONASE) 50 MCG/ACT nasal spray Place into the nose.    Marland Kitchen HYDROcodone-acetaminophen (NORCO/VICODIN) 5-325 MG tablet Take 1 tablet by mouth every 6 (six) hours as needed for moderate pain.    . meloxicam (MOBIC) 7.5 MG tablet TAKE 1 TABLET BY MOUTH DAILY    . omeprazole (PRILOSEC) 20 MG capsule TAKE 1 CAPSULE BY MOUTH USUALLY 30 MINUTES BEFORE BREAKFAST    . simvastatin (ZOCOR) 40 MG tablet TAKE 1 TABLET BY MOUTH AT BEDTIME    .  vitamin B-12 (CYANOCOBALAMIN) 500 MCG tablet Take by mouth.     No current facility-administered medications for this visit.      Musculoskeletal: Strength & Muscle Tone: within normal limits Gait & Station: normal Patient leans: N/A  Psychiatric Specialty Exam: Review of Systems  Psychiatric/Behavioral: Positive for depression. The patient is nervous/anxious.   All other systems reviewed and are negative.   Blood pressure 136/80, pulse (!) 109, temperature 98.3 F (36.8 C), temperature source Oral, weight 159 lb 3.2 oz (72.2 kg).Body mass index is 29.12 kg/m.  General Appearance: Casual  Eye Contact:  Fair  Speech:  Normal Rate  Volume:  Normal  Mood:  Anxious and Dysphoric  Affect:  Congruent  Thought Process:  Goal Directed and Descriptions of Associations: Intact  Orientation:  Full (Time, Place, and Person)  Thought Content: Logical   Suicidal Thoughts:  No  Homicidal Thoughts:  No  Memory:  Immediate;   Fair Recent;   Fair Remote;   Fair  Judgement:  Fair  Insight:  Fair  Psychomotor Activity:  Normal  Concentration:  Concentration: Fair and Attention Span: Fair  Recall:  [employer] of Knowledge: Fair  Language: Fair  Akathisia:  No  Handed:  Right  AIMS (if indicated): na  Assets:  Communication Skills Desire for Improvement Social Support  ADL's:  Intact  Cognition:  WNL  Sleep:  improved   Screenings:   Assessment and Plan: Kristen Potts is a 72 year old Caucasian female who has a history of depression, anxiety, high blood pressure, hyperlipidemia, GERD, arthritis, osteoporosis, presented to the clinic today for a follow-up visit.  Patient has multiple psychosocial stressors including several deaths in her family recently.  Patient also has had multiple medication trials however has a history of not getting medications enough time.  Patient does have good social support from her sister who also presented to the clinic today.  Patient reports she has started grief therapy at her church.  She however continues to have anxiety and depression.  We will continue to make medication readjustment as noted below.  Plan MDD Continue Prozac 10 mg p.o. daily.  Discussed with patient to take the Prozac with supper in the evening since she feels tired during the day when she takes it. Increase BuSpar to 10 mg p.o. twice daily.  GAD Prozac 10 mg p.o. daily BuSpar 10 mg p.o. twice daily  For insomnia Continue doxepin 10 mg p.o. nightly.  Patient reports sleep is improved.  She will continue grief counseling at her church.  Follow-up in clinic in 3 weeks or sooner if needed.  More than 50 % of the time was spent for psychoeducation and supportive psychotherapy and care coordination.  This note was generated in part or whole with voice recognition software. Voice recognition is usually quite accurate but there are transcription errors that can and very often do occur. I apologize for any typographical errors that were not detected and corrected.         Ursula Alert, MD 11/19/2017, 8:26 AM

## 2017-11-18 NOTE — Patient Instructions (Signed)
Please start taking your Prozac 10 mg with supper in the evening.  Please start taking a higher dose of Buspar 10 mg twice a day.

## 2017-11-19 ENCOUNTER — Encounter: Payer: Self-pay | Admitting: Psychiatry

## 2017-11-22 DIAGNOSIS — F5104 Psychophysiologic insomnia: Secondary | ICD-10-CM | POA: Diagnosis not present

## 2017-11-22 DIAGNOSIS — R5382 Chronic fatigue, unspecified: Secondary | ICD-10-CM | POA: Diagnosis not present

## 2017-11-22 DIAGNOSIS — M5116 Intervertebral disc disorders with radiculopathy, lumbar region: Secondary | ICD-10-CM | POA: Diagnosis not present

## 2017-12-09 ENCOUNTER — Ambulatory Visit: Payer: PPO | Admitting: Psychiatry

## 2018-01-24 DIAGNOSIS — F3341 Major depressive disorder, recurrent, in partial remission: Secondary | ICD-10-CM | POA: Diagnosis not present

## 2018-01-24 DIAGNOSIS — J431 Panlobular emphysema: Secondary | ICD-10-CM | POA: Diagnosis not present

## 2018-04-05 DIAGNOSIS — H6980 Other specified disorders of Eustachian tube, unspecified ear: Secondary | ICD-10-CM | POA: Diagnosis not present

## 2018-04-05 DIAGNOSIS — T162XXA Foreign body in left ear, initial encounter: Secondary | ICD-10-CM | POA: Diagnosis not present

## 2018-04-12 DIAGNOSIS — Z961 Presence of intraocular lens: Secondary | ICD-10-CM | POA: Diagnosis not present

## 2018-04-12 DIAGNOSIS — H2512 Age-related nuclear cataract, left eye: Secondary | ICD-10-CM | POA: Diagnosis not present

## 2018-04-12 DIAGNOSIS — H25012 Cortical age-related cataract, left eye: Secondary | ICD-10-CM | POA: Diagnosis not present

## 2018-04-12 DIAGNOSIS — H52223 Regular astigmatism, bilateral: Secondary | ICD-10-CM | POA: Diagnosis not present

## 2018-04-12 DIAGNOSIS — H524 Presbyopia: Secondary | ICD-10-CM | POA: Diagnosis not present

## 2018-04-12 DIAGNOSIS — H5203 Hypermetropia, bilateral: Secondary | ICD-10-CM | POA: Diagnosis not present

## 2018-05-02 DIAGNOSIS — E782 Mixed hyperlipidemia: Secondary | ICD-10-CM | POA: Diagnosis not present

## 2018-05-02 DIAGNOSIS — J431 Panlobular emphysema: Secondary | ICD-10-CM | POA: Diagnosis not present

## 2018-05-02 DIAGNOSIS — M1990 Unspecified osteoarthritis, unspecified site: Secondary | ICD-10-CM | POA: Diagnosis not present

## 2018-05-02 DIAGNOSIS — M5010 Cervical disc disorder with radiculopathy, unspecified cervical region: Secondary | ICD-10-CM | POA: Diagnosis not present

## 2018-05-02 DIAGNOSIS — M818 Other osteoporosis without current pathological fracture: Secondary | ICD-10-CM | POA: Diagnosis not present

## 2018-05-02 DIAGNOSIS — Z23 Encounter for immunization: Secondary | ICD-10-CM | POA: Diagnosis not present

## 2018-08-16 DIAGNOSIS — E782 Mixed hyperlipidemia: Secondary | ICD-10-CM | POA: Diagnosis not present

## 2018-08-23 DIAGNOSIS — M818 Other osteoporosis without current pathological fracture: Secondary | ICD-10-CM | POA: Diagnosis not present

## 2018-08-23 DIAGNOSIS — J431 Panlobular emphysema: Secondary | ICD-10-CM | POA: Diagnosis not present

## 2018-08-23 DIAGNOSIS — E782 Mixed hyperlipidemia: Secondary | ICD-10-CM | POA: Diagnosis not present

## 2018-08-23 DIAGNOSIS — M659 Synovitis and tenosynovitis, unspecified: Secondary | ICD-10-CM | POA: Diagnosis not present

## 2018-08-23 DIAGNOSIS — Z0000 Encounter for general adult medical examination without abnormal findings: Secondary | ICD-10-CM | POA: Diagnosis not present

## 2018-08-23 DIAGNOSIS — F3341 Major depressive disorder, recurrent, in partial remission: Secondary | ICD-10-CM | POA: Diagnosis not present

## 2018-08-31 DIAGNOSIS — M810 Age-related osteoporosis without current pathological fracture: Secondary | ICD-10-CM | POA: Diagnosis not present

## 2018-11-30 ENCOUNTER — Other Ambulatory Visit: Payer: Self-pay | Admitting: Internal Medicine

## 2018-11-30 DIAGNOSIS — Z1231 Encounter for screening mammogram for malignant neoplasm of breast: Secondary | ICD-10-CM

## 2018-12-19 ENCOUNTER — Other Ambulatory Visit: Payer: Self-pay

## 2018-12-19 ENCOUNTER — Ambulatory Visit
Admission: RE | Admit: 2018-12-19 | Discharge: 2018-12-19 | Disposition: A | Discharge status: Home / Self Care | Payer: PPO | Source: Ambulatory Visit | Attending: Internal Medicine | Admitting: Internal Medicine

## 2018-12-19 DIAGNOSIS — Z1231 Encounter for screening mammogram for malignant neoplasm of breast: Secondary | ICD-10-CM | POA: Insufficient documentation

## 2019-02-14 DIAGNOSIS — E782 Mixed hyperlipidemia: Secondary | ICD-10-CM | POA: Diagnosis not present

## 2019-02-21 DIAGNOSIS — E782 Mixed hyperlipidemia: Secondary | ICD-10-CM | POA: Diagnosis not present

## 2019-02-21 DIAGNOSIS — F3341 Major depressive disorder, recurrent, in partial remission: Secondary | ICD-10-CM | POA: Diagnosis not present

## 2019-02-21 DIAGNOSIS — M76899 Other specified enthesopathies of unspecified lower limb, excluding foot: Secondary | ICD-10-CM | POA: Diagnosis not present

## 2019-02-21 DIAGNOSIS — M1990 Unspecified osteoarthritis, unspecified site: Secondary | ICD-10-CM | POA: Diagnosis not present

## 2019-02-21 DIAGNOSIS — M818 Other osteoporosis without current pathological fracture: Secondary | ICD-10-CM | POA: Diagnosis not present

## 2019-03-02 DIAGNOSIS — H2512 Age-related nuclear cataract, left eye: Secondary | ICD-10-CM | POA: Diagnosis not present

## 2019-03-21 DIAGNOSIS — H2512 Age-related nuclear cataract, left eye: Secondary | ICD-10-CM | POA: Diagnosis not present

## 2019-03-21 DIAGNOSIS — I10 Essential (primary) hypertension: Secondary | ICD-10-CM | POA: Diagnosis not present

## 2019-03-22 ENCOUNTER — Other Ambulatory Visit: Payer: Self-pay

## 2019-03-22 ENCOUNTER — Encounter: Payer: Self-pay | Admitting: *Deleted

## 2019-03-23 NOTE — Discharge Instructions (Signed)

## 2019-03-24 ENCOUNTER — Other Ambulatory Visit: Payer: Self-pay

## 2019-03-24 ENCOUNTER — Other Ambulatory Visit
Admission: RE | Admit: 2019-03-24 | Discharge: 2019-03-24 | Disposition: A | Discharge status: Home / Self Care | Payer: PPO | Source: Ambulatory Visit | Attending: Ophthalmology | Admitting: Ophthalmology

## 2019-03-24 DIAGNOSIS — Z01812 Encounter for preprocedural laboratory examination: Secondary | ICD-10-CM | POA: Insufficient documentation

## 2019-03-24 DIAGNOSIS — Z20828 Contact with and (suspected) exposure to other viral communicable diseases: Secondary | ICD-10-CM | POA: Insufficient documentation

## 2019-03-24 LAB — SARS CORONAVIRUS 2 (TAT 6-24 HRS): SARS Coronavirus 2: NEGATIVE

## 2019-03-27 NOTE — Anesthesia Preprocedure Evaluation (Addendum)
Anesthesia Evaluation  Patient identified by MRN, date of birth, ID band Patient awake    Reviewed: Allergy & Precautions, NPO status , Patient's Chart, lab work & pertinent test results  History of Anesthesia Complications Negative for: history of anesthetic complications  Airway Mallampati: III  TM Distance: >3 FB Neck ROM: Limited    Dental  (+) Upper Dentures, Lower Dentures   Pulmonary COPD, Current Smoker and Patient abstained from smoking.,    breath sounds clear to auscultation       Cardiovascular hypertension, (-) angina(-) DOE  Rhythm:Regular Rate:Normal   HLD   Neuro/Psych PSYCHIATRIC DISORDERS Anxiety Depression    GI/Hepatic GERD  Controlled,  Endo/Other    Renal/GU      Musculoskeletal  (+) Arthritis ,   Abdominal   Peds  Hematology   Anesthesia Other Findings   Reproductive/Obstetrics                            Anesthesia Physical Anesthesia Plan  ASA: II  Anesthesia Plan: MAC   Post-op Pain Management:    Induction: Intravenous  PONV Risk Score and Plan: 1 and TIVA and Midazolam  Airway Management Planned: Nasal Cannula  Additional Equipment:   Intra-op Plan:   Post-operative Plan:   Informed Consent: I have reviewed the patients History and Physical, chart, labs and discussed the procedure including the risks, benefits and alternatives for the proposed anesthesia with the patient or authorized representative who has indicated his/her understanding and acceptance.       Plan Discussed with: CRNA and Anesthesiologist  Anesthesia Plan Comments:         Anesthesia Quick Evaluation

## 2019-03-28 ENCOUNTER — Ambulatory Visit
Admission: RE | Admit: 2019-03-28 | Discharge: 2019-03-28 | Disposition: A | Discharge status: Home / Self Care | Payer: PPO | Attending: Ophthalmology | Admitting: Ophthalmology

## 2019-03-28 ENCOUNTER — Ambulatory Visit: Payer: PPO | Admitting: Anesthesiology

## 2019-03-28 ENCOUNTER — Other Ambulatory Visit: Payer: Self-pay

## 2019-03-28 ENCOUNTER — Encounter
Admission: RE | Disposition: A | Discharge status: Home / Self Care | Payer: Self-pay | Source: Home / Self Care | Attending: Ophthalmology

## 2019-03-28 DIAGNOSIS — I10 Essential (primary) hypertension: Secondary | ICD-10-CM | POA: Insufficient documentation

## 2019-03-28 DIAGNOSIS — M1990 Unspecified osteoarthritis, unspecified site: Secondary | ICD-10-CM | POA: Insufficient documentation

## 2019-03-28 DIAGNOSIS — Z79899 Other long term (current) drug therapy: Secondary | ICD-10-CM | POA: Insufficient documentation

## 2019-03-28 DIAGNOSIS — Z882 Allergy status to sulfonamides status: Secondary | ICD-10-CM | POA: Insufficient documentation

## 2019-03-28 DIAGNOSIS — E7800 Pure hypercholesterolemia, unspecified: Secondary | ICD-10-CM | POA: Insufficient documentation

## 2019-03-28 DIAGNOSIS — F419 Anxiety disorder, unspecified: Secondary | ICD-10-CM | POA: Insufficient documentation

## 2019-03-28 DIAGNOSIS — K219 Gastro-esophageal reflux disease without esophagitis: Secondary | ICD-10-CM | POA: Insufficient documentation

## 2019-03-28 DIAGNOSIS — E785 Hyperlipidemia, unspecified: Secondary | ICD-10-CM | POA: Insufficient documentation

## 2019-03-28 DIAGNOSIS — Z7983 Long term (current) use of bisphosphonates: Secondary | ICD-10-CM | POA: Insufficient documentation

## 2019-03-28 DIAGNOSIS — F329 Major depressive disorder, single episode, unspecified: Secondary | ICD-10-CM | POA: Diagnosis not present

## 2019-03-28 DIAGNOSIS — H2512 Age-related nuclear cataract, left eye: Secondary | ICD-10-CM | POA: Diagnosis not present

## 2019-03-28 DIAGNOSIS — Z791 Long term (current) use of non-steroidal anti-inflammatories (NSAID): Secondary | ICD-10-CM | POA: Insufficient documentation

## 2019-03-28 DIAGNOSIS — F17200 Nicotine dependence, unspecified, uncomplicated: Secondary | ICD-10-CM | POA: Insufficient documentation

## 2019-03-28 DIAGNOSIS — J449 Chronic obstructive pulmonary disease, unspecified: Secondary | ICD-10-CM | POA: Diagnosis not present

## 2019-03-28 DIAGNOSIS — H25812 Combined forms of age-related cataract, left eye: Secondary | ICD-10-CM | POA: Diagnosis not present

## 2019-03-28 HISTORY — PX: CATARACT EXTRACTION W/PHACO: SHX586

## 2019-03-28 HISTORY — DX: Presence of dental prosthetic device (complete) (partial): Z97.2

## 2019-03-28 HISTORY — DX: Essential (primary) hypertension: I10

## 2019-03-28 SURGERY — PHACOEMULSIFICATION, CATARACT, WITH IOL INSERTION
Anesthesia: Monitor Anesthesia Care | Site: Eye | Laterality: Left

## 2019-03-28 MED ORDER — ONDANSETRON HCL 4 MG/2ML IJ SOLN: 4.0000 mg | Freq: Once | INTRAMUSCULAR | Status: DC | PRN

## 2019-03-28 MED ORDER — EPINEPHRINE PF 1 MG/ML IJ SOLN
INTRAOCULAR | Status: DC | PRN
  Administered 2019-03-28: 11:00:00 104 mL via OPHTHALMIC

## 2019-03-28 MED ORDER — BRIMONIDINE TARTRATE-TIMOLOL 0.2-0.5 % OP SOLN
OPHTHALMIC | Status: DC | PRN
  Administered 2019-03-28: 1 [drp] via OPHTHALMIC

## 2019-03-28 MED ORDER — LACTATED RINGERS IV SOLN: 100.0000 mL/h | INTRAVENOUS | Status: DC

## 2019-03-28 MED ORDER — ACETAMINOPHEN 10 MG/ML IV SOLN: 1000.0000 mg | Freq: Once | INTRAVENOUS | Status: DC | PRN

## 2019-03-28 MED ORDER — NA CHONDROIT SULF-NA HYALURON 40-17 MG/ML IO SOLN
INTRAOCULAR | Status: DC | PRN
  Administered 2019-03-28: 1 mL via INTRAOCULAR

## 2019-03-28 MED ORDER — ARMC OPHTHALMIC DILATING DROPS
1.0000 "application " | OPHTHALMIC | Status: DC | PRN
  Administered 2019-03-28 (×3): 1 via OPHTHALMIC

## 2019-03-28 MED ORDER — TETRACAINE HCL 0.5 % OP SOLN
1.0000 [drp] | OPHTHALMIC | Status: DC | PRN
  Administered 2019-03-28 (×3): 1 [drp] via OPHTHALMIC

## 2019-03-28 MED ORDER — FENTANYL CITRATE (PF) 100 MCG/2ML IJ SOLN
INTRAMUSCULAR | Status: DC | PRN
  Administered 2019-03-28 (×2): 50 ug via INTRAVENOUS

## 2019-03-28 MED ORDER — MOXIFLOXACIN HCL 0.5 % OP SOLN
OPHTHALMIC | Status: DC | PRN
  Administered 2019-03-28: 0.2 mL via OPHTHALMIC

## 2019-03-28 MED ORDER — MIDAZOLAM HCL 2 MG/2ML IJ SOLN
INTRAMUSCULAR | Status: DC | PRN
  Administered 2019-03-28: 2 mg via INTRAVENOUS

## 2019-03-28 MED ORDER — LIDOCAINE HCL (PF) 2 % IJ SOLN
INTRAOCULAR | Status: DC | PRN
  Administered 2019-03-28: 1 mL

## 2019-03-28 SURGICAL SUPPLY — 18 items
CANNULA ANT/CHMB 27GA (MISCELLANEOUS) ×6 IMPLANT
GLOVE SURG LX 8.0 MICRO (GLOVE) ×2
GLOVE SURG LX STRL 8.0 MICRO (GLOVE) ×1 IMPLANT
GLOVE SURG TRIUMPH 8.0 PF LTX (GLOVE) ×3 IMPLANT
GOWN STRL REUS W/ TWL LRG LVL3 (GOWN DISPOSABLE) ×2 IMPLANT
GOWN STRL REUS W/TWL LRG LVL3 (GOWN DISPOSABLE) ×4
LENS IOL TECNIS ITEC 26.5 (Intraocular Lens) ×3 IMPLANT
MARKER SKIN DUAL TIP RULER LAB (MISCELLANEOUS) ×3 IMPLANT
NDL RETROBULBAR .5 NSTRL (NEEDLE) ×3 IMPLANT
NEEDLE FILTER BLUNT 18X 1/2SAF (NEEDLE) ×2
NEEDLE FILTER BLUNT 18X1 1/2 (NEEDLE) ×1 IMPLANT
PACK EYE AFTER SURG (MISCELLANEOUS) ×3 IMPLANT
PACK OPTHALMIC (MISCELLANEOUS) ×3 IMPLANT
PACK PORFILIO (MISCELLANEOUS) ×3 IMPLANT
SYR 3ML LL SCALE MARK (SYRINGE) ×3 IMPLANT
SYR TB 1ML LUER SLIP (SYRINGE) ×3 IMPLANT
WATER STERILE IRR 250ML POUR (IV SOLUTION) ×3 IMPLANT
WIPE NON LINTING 3.25X3.25 (MISCELLANEOUS) ×3 IMPLANT

## 2019-03-28 NOTE — Transfer of Care (Signed)
Immediate Anesthesia Transfer of Care Note  Patient: Kristen Potts  Procedure(s) Performed: CATARACT EXTRACTION PHACO AND INTRAOCULAR LENS PLACEMENT (IOC) LEFT (Left Eye)  Patient Location: PACU  Anesthesia Type: MAC  Level of Consciousness: awake, alert  and patient cooperative  Airway and Oxygen Therapy: Patient Spontanous Breathing and Patient connected to supplemental oxygen  Post-op Assessment: Post-op Vital signs reviewed, Patient's Cardiovascular Status Stable, Respiratory Function Stable, Patent Airway and No signs of Nausea or vomiting  Post-op Vital Signs: Reviewed and stable  Complications: No apparent anesthesia complications

## 2019-03-28 NOTE — Anesthesia Postprocedure Evaluation (Signed)
Anesthesia Post Note  Patient: Kristen Potts  Procedure(s) Performed: CATARACT EXTRACTION PHACO AND INTRAOCULAR LENS PLACEMENT (IOC) LEFT  01:03.9  15.0%  9.59 (Left Eye)  Patient location during evaluation: PACU Anesthesia Type: MAC Level of consciousness: awake and alert Pain management: pain level controlled Vital Signs Assessment: post-procedure vital signs reviewed and stable Respiratory status: spontaneous breathing, nonlabored ventilation, respiratory function stable and patient connected to nasal cannula oxygen Cardiovascular status: stable and blood pressure returned to baseline Postop Assessment: no apparent nausea or vomiting Anesthetic complications: no    Izaha Shughart A  Aesha Agrawal

## 2019-03-28 NOTE — Op Note (Signed)
PREOPERATIVE DIAGNOSIS:  Nuclear sclerotic cataract of the left eye.   POSTOPERATIVE DIAGNOSIS:  Nuclear sclerotic cataract of the left eye.   OPERATIVE PROCEDURE:@   SURGEON:  Birder Robson, MD.   ANESTHESIA:  Anesthesiologist: Heniser, Fredric Dine, MD CRNA: Jeannene Patella, CRNA  1.      Managed anesthesia care. 2.     0.89ml of Shugarcaine was instilled following the paracentesis   COMPLICATIONS:  None.   TECHNIQUE:   Stop and chop   DESCRIPTION OF PROCEDURE:  The patient was examined and consented in the preoperative holding area where the aforementioned topical anesthesia was applied to the left eye and then brought back to the Operating Room where the left eye was prepped and draped in the usual sterile ophthalmic fashion and a lid speculum was placed. A paracentesis was created with the side port blade and the anterior chamber was filled with viscoelastic. A near clear corneal incision was performed with the steel keratome. A continuous curvilinear capsulorrhexis was performed with a cystotome followed by the capsulorrhexis forceps. Hydrodissection and hydrodelineation were carried out with BSS on a blunt cannula. The lens was removed in a stop and chop  technique and the remaining cortical material was removed with the irrigation-aspiration handpiece. The capsular bag was inflated with viscoelastic and the Technis [id number] lens was placed in the capsular bag without complication. The remaining viscoelastic was removed from the eye with the irrigation-aspiration handpiece. The wounds were hydrated. The anterior chamber was flushed with BSS and the eye was inflated to physiologic pressure. 0.41ml Vigamox was placed in the anterior chamber. The wounds were found to be water tight. The eye was dressed with Combigan. The patient was given protective glasses to wear throughout the day and a shield with which to sleep tonight. The patient was also given drops with which to begin a drop regimen  today and will follow-up with me in one day. Implant Name Type Inv. Item Serial No. Manufacturer Lot No. LRB No. Used Action  LENS IOL DIOP 26.5 - [id number] Intraocular Lens LENS IOL DIOP 26.5 [id number]  Left 1 Implanted    Procedure(s): CATARACT EXTRACTION PHACO AND INTRAOCULAR LENS PLACEMENT (IOC) LEFT  01:03.9  15.0%  9.59 (Left)  Electronically signed: Birder Robson 03/28/2019 11:04 AM

## 2019-03-28 NOTE — H&P (Signed)
All labs reviewed. Abnormal studies sent to patients PCP when indicated.  Previous H&P reviewed, patient examined, there are NO CHANGES.  Kristen Potts Porfilio9/22/202010:37 AM

## 2019-03-29 ENCOUNTER — Encounter: Payer: Self-pay | Admitting: Ophthalmology

## 2019-05-04 DIAGNOSIS — Z961 Presence of intraocular lens: Secondary | ICD-10-CM | POA: Diagnosis not present

## 2019-07-04 ENCOUNTER — Ambulatory Visit: Payer: PPO | Attending: Internal Medicine

## 2019-07-04 DIAGNOSIS — Z20822 Contact with and (suspected) exposure to covid-19: Secondary | ICD-10-CM

## 2019-07-04 DIAGNOSIS — Z20828 Contact with and (suspected) exposure to other viral communicable diseases: Secondary | ICD-10-CM | POA: Diagnosis not present

## 2019-07-06 ENCOUNTER — Telehealth: Payer: Self-pay | Admitting: General Practice

## 2019-07-06 LAB — NOVEL CORONAVIRUS, NAA: SARS-CoV-2, NAA: NOT DETECTED

## 2019-07-06 NOTE — Telephone Encounter (Signed)
Negative COVID results given. Patient results "NOT Detected." Caller expressed understanding. ° °

## 2020-02-12 ENCOUNTER — Other Ambulatory Visit: Payer: Self-pay | Admitting: Internal Medicine

## 2020-02-12 DIAGNOSIS — Z1231 Encounter for screening mammogram for malignant neoplasm of breast: Secondary | ICD-10-CM

## 2020-03-12 ENCOUNTER — Ambulatory Visit
Admission: RE | Admit: 2020-03-12 | Discharge: 2020-03-12 | Disposition: A | Discharge status: Home / Self Care | Payer: Medicare HMO | Source: Ambulatory Visit | Attending: Internal Medicine | Admitting: Internal Medicine

## 2020-03-12 ENCOUNTER — Other Ambulatory Visit: Payer: Self-pay

## 2020-03-12 DIAGNOSIS — Z1231 Encounter for screening mammogram for malignant neoplasm of breast: Secondary | ICD-10-CM

## 2021-03-19 ENCOUNTER — Other Ambulatory Visit: Payer: Self-pay | Admitting: Internal Medicine

## 2021-03-19 DIAGNOSIS — Z1231 Encounter for screening mammogram for malignant neoplasm of breast: Secondary | ICD-10-CM

## 2021-03-31 ENCOUNTER — Other Ambulatory Visit (INDEPENDENT_AMBULATORY_CARE_PROVIDER_SITE_OTHER): Payer: Self-pay | Admitting: Vascular Surgery

## 2021-03-31 DIAGNOSIS — I739 Peripheral vascular disease, unspecified: Secondary | ICD-10-CM

## 2021-04-01 ENCOUNTER — Ambulatory Visit (INDEPENDENT_AMBULATORY_CARE_PROVIDER_SITE_OTHER): Payer: Medicare HMO | Admitting: Nurse Practitioner

## 2021-04-01 ENCOUNTER — Other Ambulatory Visit: Payer: Self-pay

## 2021-04-01 ENCOUNTER — Ambulatory Visit (INDEPENDENT_AMBULATORY_CARE_PROVIDER_SITE_OTHER): Payer: Medicare HMO

## 2021-04-01 ENCOUNTER — Encounter (INDEPENDENT_AMBULATORY_CARE_PROVIDER_SITE_OTHER): Payer: Self-pay | Admitting: Nurse Practitioner

## 2021-04-01 VITALS — BP 142/89 | HR 90 | Resp 16 | Ht 67.0 in | Wt 179.6 lb

## 2021-04-01 DIAGNOSIS — E782 Mixed hyperlipidemia: Secondary | ICD-10-CM

## 2021-04-01 DIAGNOSIS — I739 Peripheral vascular disease, unspecified: Secondary | ICD-10-CM

## 2021-04-04 ENCOUNTER — Encounter (INDEPENDENT_AMBULATORY_CARE_PROVIDER_SITE_OTHER): Payer: Self-pay | Admitting: Nurse Practitioner

## 2021-04-04 NOTE — Progress Notes (Signed)
Subjective:    Patient ID: Kristen Potts, female    DOB: Feb 29, 1946, 75 y.o.   MRN: [id number] Chief Complaint  Patient presents with   New Patient (Initial Visit)    Ref miler PAD/claudication     Kristen Potts is a 75 year old female that presents today as a referral from her primary care provider Dr. Sabra Heck. The patient is seen for evaluation of painful lower extremities and diminished pulses. Patient notes the pain is always associated with activity and is very consistent day today. Typically, the pain occurs at less than one block, progress is as activity continues to the point that the patient must stop walking. Resting including standing still for several minutes allowed resumption of the activity and the ability to walk a similar distance before stopping again. Uneven terrain and inclined shorten the distance. The pain has been progressive over the past several years.  The patient denies rest pain or dangling of an extremity off the side of the bed during the night for relief. No open wounds or sores at this time. No prior interventions or surgeries.  No history of back problems or DJD of the lumbar sacral spine.   The patient denies changes in claudication symptoms or new rest pain symptoms.  No new ulcers or wounds of the foot.  The patient's blood pressure has been stable and relatively well controlled. The patient denies amaurosis fugax or recent TIA symptoms. There are no recent neurological changes noted. The patient denies history of DVT, PE or superficial thrombophlebitis. The patient denies recent episodes of angina or shortness of breath.   Today noninvasive studies show an ABI 0.68 on the right and 0.66 on the left.  The patient has biphasic waveforms bilaterally with slightly dampened toe waveforms bilaterally   Review of Systems  Cardiovascular:  Negative for leg swelling.       Claudication  All other systems reviewed and are negative.     Objective:    Physical Exam Vitals reviewed.  HENT:     Head: Normocephalic.  Cardiovascular:     Rate and Rhythm: Normal rate.     Pulses:          Dorsalis pedis pulses are 1+ on the right side and 1+ on the left side.       Posterior tibial pulses are 1+ on the right side and 1+ on the left side.  Pulmonary:     Effort: Pulmonary effort is normal.  Skin:    General: Skin is warm and dry.  Neurological:     Mental Status: She is alert and oriented to person, place, and time.  Psychiatric:        Mood and Affect: Mood normal.        Behavior: Behavior normal.        Thought Content: Thought content normal.        Judgment: Judgment normal.    BP (!) 142/89 (BP Location: Right Arm)   Pulse 90   Resp 16   Ht 5\' 7"  (1.702 m)   Wt 179 lb 9.6 oz (81.5 kg)   BMI 28.13 kg/m   Past Medical History:  Diagnosis Date   Anxiety    Arthritis    knees   COPD (chronic obstructive pulmonary disease) (HCC)    Depression    GERD (gastroesophageal reflux disease)    Glaucoma    Hyperlipidemia    Hypertension    Vocal cord polyp    Wears dentures  full upper and lower    Social History   Socioeconomic History   Marital status: Widowed    Spouse name: Not on file   Number of children: 2   Years of education: Not on file   Highest education level: 11th grade  Occupational History    Comment: retired  Tobacco Use   Smoking status: Every Day    Years: 10.00    Types: Cigarettes   Smokeless tobacco: Never   Tobacco comments:    2-3 cigs./day  Vaping Use   Vaping Use: Former  Substance and Sexual Activity   Alcohol use: Not Currently   Drug use: Not Currently   Sexual activity: Not Currently  Other Topics Concern   Not on file  Social History Narrative   Not on file   Social Determinants of Health   Financial Resource Strain: Not on file  Food Insecurity: Not on file  Transportation Needs: Not on file  Physical Activity: Not on file  Stress: Not on file  Social  Connections: Not on file  Intimate Partner Violence: Not on file    Past Surgical History:  Procedure Laterality Date   CATARACT EXTRACTION     CATARACT EXTRACTION W/PHACO Left 03/28/2019   Procedure: CATARACT EXTRACTION PHACO AND INTRAOCULAR LENS PLACEMENT (IOC) LEFT  01:03.9  15.0%  9.59;  Surgeon: Birder Robson, MD;  Location: [facility];  Service: Ophthalmology;  Laterality: Left;   COLONOSCOPY  11/08/2006   COLONOSCOPY W/ POLYPECTOMY  04/06/2014   adenomatous polyps, family history of colon polyps   COLONOSCOPY WITH PROPOFOL N/A 11/10/2017   Procedure: COLONOSCOPY WITH PROPOFOL;  Surgeon: Manya Silvas, MD;  [facility] ENDOSCOPY;  Service: Endoscopy;  Laterality: N/A;   TONSILLECTOMY     and adenoidectomy   vocal cord polypectomy  2012    Family History  Problem Relation Age of Onset   Alcohol abuse Brother    Drug abuse Brother    Breast cancer Neg Hx     Allergies  Allergen Reactions   Codeine Nausea Only   Mirtazapine Other (See Comments)    Didn't work Other reaction(s): Dizziness, Other (See Comments) Didn't work   Sulfa Antibiotics Other (See Comments)   Tramadol Other (See Comments)    Feels bad   Benzodiazepines Anxiety    No flowsheet data found.    CMP  No results found for: NA, K, CL, CO2, GLUCOSE, BUN, CREATININE, CALCIUM, PROT, ALBUMIN, AST, ALT, ALKPHOS, BILITOT, GFRNONAA, GFRAA   VAS [place] ABI WITH/WO TBI  Result Date: 04/04/2021  LOWER EXTREMITY DOPPLER STUDY Patient Name:  Kristen Potts  Date of Exam:   04/01/2021 Medical Rec #: [id number]      Accession #:    [id number] Date of Birth: 02/06/1946      Patient Gender: F Patient Age:   57 years Exam Location:  [facility] Procedure:      VAS [place] ABI WITH/WO TBI Referring Phys: Leotis Pain --------------------------------------------------------------------------------  Indications: Claudication.  Performing Technologist: Almira Coaster RVS  Examination Guidelines: A complete  evaluation includes at minimum, Doppler waveform signals and systolic blood pressure reading at the level of bilateral brachial, anterior tibial, and posterior tibial arteries, when vessel segments are accessible. Bilateral testing is considered an integral part of a complete examination. Photoelectric Plethysmograph (PPG) waveforms and toe systolic pressure readings are included as required and additional duplex testing as needed. Limited examinations for reoccurring indications may be performed as noted.  ABI Findings: +---------+------------------+-----+--------+--------+ Right  Rt Pressure (mmHg)IndexWaveformComment  +---------+------------------+-----+--------+--------+ Brachial 168                                     +---------+------------------+-----+--------+--------+ ATA      102                    biphasic.61      +---------+------------------+-----+--------+--------+ PTA      114               0.68 biphasic         +---------+------------------+-----+--------+--------+ Great Toe66                0.39 Abnormal         +---------+------------------+-----+--------+--------+ +---------+------------------+-----+--------+-------+ Left     Lt Pressure (mmHg)IndexWaveformComment +---------+------------------+-----+--------+-------+ Brachial 167                                    +---------+------------------+-----+--------+-------+ ATA      108                    biphasic.64     +---------+------------------+-----+--------+-------+ PTA      111               0.66 biphasic        +---------+------------------+-----+--------+-------+ Great Toe87                0.52 Abnormal        +---------+------------------+-----+--------+-------+ +-------+-----------+-----------+------------+------------+ ABI/TBIToday's ABIToday's TBIPrevious ABIPrevious TBI +-------+-----------+-----------+------------+------------+ Right  .68        .39                                  +-------+-----------+-----------+------------+------------+ Left   .66        .52                                 +-------+-----------+-----------+------------+------------+  Summary: Right: Resting right ankle-brachial index indicates moderate right lower extremity arterial disease. The right toe-brachial index is abnormal. Left: Resting left ankle-brachial index indicates moderate left lower extremity arterial disease. The left toe-brachial index is abnormal.  *See table(s) above for measurements and observations.  Electronically signed by Leotis Pain MD on 04/04/2021 at 9:03:41 AM.    Final        Assessment & Plan:   1. PAD (peripheral artery disease) (HCC)  Recommend:  The patient has evidence of atherosclerosis of the lower extremities with claudication.  There is currently no rest pain.  The patient does not voice lifestyle limiting changes at this point in time.  Noninvasive studies do not suggest clinically significant change.  No invasive studies, angiography or surgery at this time The patient should continue walking and begin a more formal exercise program.  The patient should continue antiplatelet therapy and aggressive treatment of the lipid abnormalities  No changes in the patient's medications at this time  The patient should continue wearing graduated compression socks 10-15 mmHg strength to control the mild edema.    The patient return in 6 months for noninvasive studies or sooner if claudication worsens or she begins to develop rest pain. 2. Hyperlipidemia, mixed Continue statin as ordered and reviewed, no changes at this time  Current Outpatient Medications on File Prior to Visit  Medication Sig Dispense Refill   alendronate (FOSAMAX) 70 MG tablet TAKE 1 TABLET BY MOUTH WEEKLY THE SAME DAY OF EACH WEEK. TAKE FIRST THING WITH A FULL GLASS OF WATER.     amitriptyline (ELAVIL) 25 MG tablet Take 50 mg by mouth at bedtime.     amLODipine (NORVASC) 5  MG tablet TAKE 1 TABLET BY MOUTH DAILY     Bioflavonoid Products (BIOFLEX PO) Take by mouth daily.     Calcium Carbonate-Vitamin D 600-200 MG-UNIT TABS Take by mouth.     clonazePAM (KLONOPIN) 1 MG tablet Take 1 mg by mouth daily.     etodolac (LODINE) 400 MG tablet Take 400 mg by mouth every morning.     fluticasone (FLONASE) 50 MCG/ACT nasal spray Place into the nose.     Omega-3 Fatty Acids (FISH OIL) 500 MG CAPS Take by mouth.     omeprazole (PRILOSEC) 20 MG capsule TAKE 1 CAPSULE BY MOUTH USUALLY 30 MINUTES BEFORE BREAKFAST     rosuvastatin (CRESTOR) 5 MG tablet Take 5 mg by mouth daily.     sertraline (ZOLOFT) 50 MG tablet Take 50 mg by mouth daily.     vitamin B-12 (CYANOCOBALAMIN) 500 MCG tablet Take by mouth.     Vitamin E 400 units TABS Take by mouth daily.     HYDROcodone-acetaminophen (NORCO/VICODIN) 5-325 MG tablet Take 1 tablet by mouth every 6 (six) hours as needed for moderate pain. (Patient not taking: Reported on 04/01/2021)     lovastatin (MEVACOR) 40 MG tablet Take 40 mg by mouth at bedtime. (Patient not taking: Reported on 04/01/2021)     meloxicam (MOBIC) 7.5 MG tablet TAKE 1 TABLET BY MOUTH DAILY (Patient not taking: Reported on 04/01/2021)     No current facility-administered medications on file prior to visit.    There are no Patient Instructions on file for this visit. No follow-ups on file.   Kris Hartmann, NP

## 2021-04-07 ENCOUNTER — Other Ambulatory Visit: Payer: Self-pay

## 2021-04-07 ENCOUNTER — Ambulatory Visit
Admission: RE | Admit: 2021-04-07 | Discharge: 2021-04-07 | Disposition: A | Discharge status: Home / Self Care | Payer: Medicare HMO | Source: Ambulatory Visit | Attending: Internal Medicine | Admitting: Internal Medicine

## 2021-04-07 DIAGNOSIS — Z1231 Encounter for screening mammogram for malignant neoplasm of breast: Secondary | ICD-10-CM | POA: Diagnosis present

## 2021-09-30 ENCOUNTER — Ambulatory Visit (INDEPENDENT_AMBULATORY_CARE_PROVIDER_SITE_OTHER): Payer: Medicare HMO | Admitting: Nurse Practitioner

## 2021-09-30 ENCOUNTER — Encounter (INDEPENDENT_AMBULATORY_CARE_PROVIDER_SITE_OTHER): Payer: Medicare HMO

## 2022-02-04 ENCOUNTER — Other Ambulatory Visit (INDEPENDENT_AMBULATORY_CARE_PROVIDER_SITE_OTHER): Payer: Self-pay | Admitting: Nurse Practitioner

## 2022-02-04 DIAGNOSIS — I739 Peripheral vascular disease, unspecified: Secondary | ICD-10-CM

## 2022-02-05 ENCOUNTER — Ambulatory Visit (INDEPENDENT_AMBULATORY_CARE_PROVIDER_SITE_OTHER): Payer: Medicare HMO

## 2022-02-05 ENCOUNTER — Ambulatory Visit (INDEPENDENT_AMBULATORY_CARE_PROVIDER_SITE_OTHER): Payer: Medicare HMO | Admitting: Nurse Practitioner

## 2022-02-05 ENCOUNTER — Encounter (INDEPENDENT_AMBULATORY_CARE_PROVIDER_SITE_OTHER): Payer: Self-pay | Admitting: Nurse Practitioner

## 2022-02-05 VITALS — BP 162/72 | HR 95 | Resp 16 | Wt 181.0 lb

## 2022-02-05 DIAGNOSIS — I739 Peripheral vascular disease, unspecified: Secondary | ICD-10-CM | POA: Diagnosis not present

## 2022-02-05 DIAGNOSIS — E782 Mixed hyperlipidemia: Secondary | ICD-10-CM | POA: Diagnosis not present

## 2022-02-06 ENCOUNTER — Telehealth (INDEPENDENT_AMBULATORY_CARE_PROVIDER_SITE_OTHER): Payer: Self-pay

## 2022-02-06 NOTE — Telephone Encounter (Signed)
LVM for pt to call office back.

## 2022-02-06 NOTE — Telephone Encounter (Signed)
Made pt aware that the referral was placed and the ortho office would be calling her in 1-2 weeks.  She states verbal understanding

## 2022-02-06 NOTE — Telephone Encounter (Signed)
Pt called about an appt to see orthopedic surgeons.  I spoke with [name] she is sending a referral and the office of ortho should call pt to make an appt in 1-2 weeks

## 2022-02-20 ENCOUNTER — Emergency Department
Admission: EM | Admit: 2022-02-20 | Discharge: 2022-02-20 | Disposition: A | Discharge status: Home / Self Care | Payer: Medicare HMO | Attending: Emergency Medicine | Admitting: Emergency Medicine

## 2022-02-20 ENCOUNTER — Other Ambulatory Visit: Payer: Self-pay

## 2022-02-20 ENCOUNTER — Emergency Department: Payer: Medicare HMO

## 2022-02-20 DIAGNOSIS — M5442 Lumbago with sciatica, left side: Secondary | ICD-10-CM | POA: Diagnosis not present

## 2022-02-20 DIAGNOSIS — M79605 Pain in left leg: Secondary | ICD-10-CM

## 2022-02-20 DIAGNOSIS — J449 Chronic obstructive pulmonary disease, unspecified: Secondary | ICD-10-CM | POA: Insufficient documentation

## 2022-02-20 DIAGNOSIS — M545 Low back pain, unspecified: Secondary | ICD-10-CM | POA: Diagnosis present

## 2022-02-20 DIAGNOSIS — M5136 Other intervertebral disc degeneration, lumbar region: Secondary | ICD-10-CM

## 2022-02-20 LAB — CBC WITH DIFFERENTIAL/PLATELET
Abs Immature Granulocytes: 0.1 10*3/uL — ABNORMAL HIGH (ref 0.00–0.07)
Basophils Absolute: 0 10*3/uL (ref 0.0–0.1)
Basophils Relative: 0 %
Eosinophils Absolute: 0 10*3/uL (ref 0.0–0.5)
Eosinophils Relative: 0 %
HCT: 44.5 % (ref 36.0–46.0)
Hemoglobin: 14.4 g/dL (ref 12.0–15.0)
Immature Granulocytes: 1 %
Lymphocytes Relative: 11 %
Lymphs Abs: 1.1 10*3/uL (ref 0.7–4.0)
MCH: 30.2 pg (ref 26.0–34.0)
MCHC: 32.4 g/dL (ref 30.0–36.0)
MCV: 93.3 fL (ref 80.0–100.0)
Monocytes Absolute: 0.7 10*3/uL (ref 0.1–1.0)
Monocytes Relative: 7 %
Neutro Abs: 8.1 10*3/uL — ABNORMAL HIGH (ref 1.7–7.7)
Neutrophils Relative %: 81 %
Platelets: 152 10*3/uL (ref 150–400)
RBC: 4.77 MIL/uL (ref 3.87–5.11)
RDW: 13.9 % (ref 11.5–15.5)
WBC: 10.1 10*3/uL (ref 4.0–10.5)
nRBC: 0 % (ref 0.0–0.2)

## 2022-02-20 LAB — BASIC METABOLIC PANEL
Anion gap: 10 (ref 5–15)
BUN: 25 mg/dL — ABNORMAL HIGH (ref 8–23)
CO2: 24 mmol/L (ref 22–32)
Calcium: 9.1 mg/dL (ref 8.9–10.3)
Chloride: 105 mmol/L (ref 98–111)
Creatinine, Ser: 0.94 mg/dL (ref 0.44–1.00)
GFR, Estimated: >60 mL/min (ref >60)
Glucose, Bld: 135 mg/dL — ABNORMAL HIGH (ref 70–99)
Potassium: 3.9 mmol/L (ref 3.5–5.1)
Sodium: 139 mmol/L (ref 135–145)

## 2022-02-20 LAB — URINALYSIS, ROUTINE W REFLEX MICROSCOPIC
Bilirubin Urine: NEGATIVE
Glucose, UA: NEGATIVE mg/dL
Hgb urine dipstick: NEGATIVE
Ketones, ur: NEGATIVE mg/dL
Nitrite: POSITIVE — AB
Protein, ur: 30 mg/dL — AB
Specific Gravity, Urine: 1.03 (ref 1.005–1.030)
pH: 5 (ref 5.0–8.0)

## 2022-02-20 NOTE — ED Provider Notes (Signed)
[facility] Provider Note    Event Date/Time   First MD Initiated Contact with Patient 02/20/22 1725     (approximate)   History   No chief complaint on file.   HPI  Kristen Potts is a 76 y.o. female with a history of COPD, GERD, hyperlipidemia, osteoarthritis who presents with left leg and low back pain over the last couple of months, persistent course, and associated with difficulty walking and bearing weight.  The patient has been evaluated by orthopedics several times and has received cortisone shots with minimal relief.  She has difficulty tolerating most pain medications including tramadol or narcotics.  She was planned for outpatient MRI but could not get insurance approval and was advised to come to the ED for evaluation.  The patient states that she has fallen multiple times due to her leg pain and has some weakness in the leg.  She denies any incontinence.  She states the pain is not present unless she tries to move or bear weight.    Physical Exam   Triage Vital Signs: ED Triage Vitals  Enc Vitals Group     BP 02/20/22 1503 137/89     Pulse Rate 02/20/22 1503 81     Resp 02/20/22 1503 17     Temp 02/20/22 1503 98.6 F (37 C)     Temp Source 02/20/22 1503 Oral     SpO2 02/20/22 1503 91 %     Weight --      Height --      Head Circumference --      Peak Flow --      Pain Score 02/20/22 1511 8     Pain Loc --      Pain Edu? --      Excl. in [place]? --     Most recent vital signs: Vitals:   02/20/22 1503  BP: 137/89  Pulse: 81  Resp: 17  Temp: 98.6 F (37 C)  SpO2: 91%     General: Alert and oriented, comfortable appearing. CV:  Good peripheral perfusion.  Resp:  Normal effort.  Abd:  No distention.  Other:  Full range of motion of the left hip and knee.  5/5 motor strength of bilateral lower extremities, sensation intact, difficulty lifting leg off the stretcher due to pain.     ED Results / Procedures / Treatments    Labs (all labs ordered are listed, but only abnormal results are displayed) Labs Reviewed  CBC WITH DIFFERENTIAL/PLATELET - Abnormal; Notable for the following components:      Result Value   Neutro Abs 8.1 (*)    Abs Immature Granulocytes 0.10 (*)    All other components within normal limits  BASIC METABOLIC PANEL - Abnormal; Notable for the following components:   Glucose, Bld 135 (*)    BUN 25 (*)    All other components within normal limits  URINALYSIS, ROUTINE W REFLEX MICROSCOPIC - Abnormal; Notable for the following components:   Color, Urine AMBER (*)    APPearance CLEAR (*)    Protein, ur 30 (*)    Nitrite POSITIVE (*)    Leukocytes,Ua TRACE (*)    Bacteria, UA RARE (*)    All other components within normal limits     EKG    RADIOLOGY  MR lumbar spine: Pending  PROCEDURES:  Critical Care performed: No  Procedures   MEDICATIONS ORDERED IN ED: Medications - No data to display   IMPRESSION / MDM /  ASSESSMENT AND PLAN / ED COURSE  I reviewed the triage vital signs and the nursing notes.  76 year old female with PMH as noted above presents with persistent low back and left leg pain over the last few months.  I reviewed the past medical records.  The patient was seen by Dr. Sabra Heck from orthopedics on 8/10 and plan for outpatient MRI although she was unable to get timely insurance approval.  The note from that date states "Left lumbar radiculitis-ABI unremarkable, pursuing LS MRI. Patient intolerant to tramadol, no narcotics, on Mobic, substantially disabled with some weakness of the left leg, pursue MRI urgently.  Left knee osteoarthritis-injection given without upper complication, hopefully will help her left leg,"  On exam currently, the vital signs are normal.  The patient has good range of motion of the left hip and is averse to lifting the left leg due to pain but does not appear to have significant weakness.  Differential diagnosis includes, but is not  limited to, lumbar radiculopathy, cauda equina syndrome, sciatica, lumbar compression fracture, muscle strain/spasm.  The left hip appears intact.  There is no shortening of the left lower extremity on exam.  Patient's presentation is most consistent with acute presentation with potential threat to life or bodily function.  Due to patient's age, the duration and persistence of the symptoms, there is severity including the fact that she has significant difficulty ambulating, and concern for possible permanently disabling condition such as cauda equina syndrome, it is my assessment that an emergent MRI is indicated.  We will obtain MRI of the lumbar spine and reassess.  ----------------------------------------- 7:47 PM on 02/20/2022 -----------------------------------------  MRI is pending.  If this is negative, the patient states that she would prefer to go home.  I have signed her out to the oncoming ED provider [facility] and Dr. Quentin Cornwall.   FINAL CLINICAL IMPRESSION(S) / ED DIAGNOSES   Final diagnoses:  Left-sided low back pain with left-sided sciatica, unspecified chronicity  Left leg pain     Rx / DC Orders   ED Discharge Orders     None        Note:  This document was prepared using Dragon voice recognition software and may include unintentional dictation errors.    Arta Silence, MD 02/20/22 [phone]

## 2022-02-20 NOTE — ED Triage Notes (Signed)
Pt here from [facility] with LLE pain and weakness. Pt has fallen twice and needs an MRI but was denied due to insurance.

## 2022-02-20 NOTE — Discharge Instructions (Addendum)
You have been seen today in the emergency room and had MRI studies to evaluate your lower back pain. Your MRI shows that you have several bulging disc in the lumbar region. I will provide you with information for orthopedic doctor that you can contact for follow-up appointment/evaluation/treatment Please follow-up with your primary care provider for follow-up if symptoms persist or worsen before you can follow-up with orthopedics. Please take over-the-counter anti-inflammatories for pain control.

## 2022-02-23 ENCOUNTER — Encounter (INDEPENDENT_AMBULATORY_CARE_PROVIDER_SITE_OTHER): Payer: Self-pay | Admitting: Nurse Practitioner

## 2022-02-23 NOTE — Progress Notes (Signed)
Subjective:    Patient ID: Kristen Potts, female    DOB: 27-Nov-1945, 76 y.o.   MRN: [id number] Chief Complaint  Patient presents with   Follow-up    Ultrasound follow up    The patient returns to the office for followup and review of the noninvasive studies.   There have been no interval changes in lower extremity symptoms. No interval shortening of the patient's claudication distance or development of rest pain symptoms. No new ulcers or wounds have occurred since the last visit.  There have been no significant changes to the patient's overall health care.  The patient denies amaurosis fugax or recent TIA symptoms. There are no documented recent neurological changes noted. There is no history of DVT, PE or superficial thrombophlebitis. The patient denies recent episodes of angina or shortness of breath.   ABI Rt=0.99 and Lt=0.99  (previous ABI's Rt=068 and Lt=0.66) Duplex ultrasound of the bilateral tibial arteries reveals biphasic waveforms.   Review of Systems  All other systems reviewed and are negative.      Objective:   Physical Exam Vitals reviewed.  HENT:     Head: Normocephalic.  Cardiovascular:     Rate and Rhythm: Normal rate.     Pulses: Normal pulses.  Pulmonary:     Effort: Pulmonary effort is normal.  Skin:    General: Skin is warm and dry.  Neurological:     Mental Status: She is alert and oriented to person, place, and time.  Psychiatric:        Mood and Affect: Mood normal.        Behavior: Behavior normal.        Thought Content: Thought content normal.        Judgment: Judgment normal.     BP (!) 162/72 (BP Location: Left Arm)   Pulse 95   Resp 16   Wt 181 lb (82.1 kg)   BMI 28.35 kg/m   Past Medical History:  Diagnosis Date   Anxiety    Arthritis    knees   COPD (chronic obstructive pulmonary disease) (HCC)    Depression    GERD (gastroesophageal reflux disease)    Glaucoma    Hyperlipidemia    Hypertension    Vocal cord polyp     Wears dentures    full upper and lower    Social History   Socioeconomic History   Marital status: Widowed    Spouse name: Not on file   Number of children: 2   Years of education: Not on file   Highest education level: 11th grade  Occupational History    Comment: retired  Tobacco Use   Smoking status: Former    Years: 10.00    Types: Cigarettes   Smokeless tobacco: Never   Tobacco comments:    2-3 cigs./day  Vaping Use   Vaping Use: Former  Substance and Sexual Activity   Alcohol use: Not Currently   Drug use: Not Currently   Sexual activity: Not Currently  Other Topics Concern   Not on file  Social History Narrative   Not on file   Social Determinants of Health   Financial Resource Strain: Low Risk  (12/19/2018)   Overall Financial Resource Strain (CARDIA)    Difficulty of Paying Living Expenses: Not hard at all  Food Insecurity: No Food Insecurity (12/19/2018)   Hunger Vital Sign    Worried About Running Out of Food in the Last Year: Never true    Ran Out of  Food in the Last Year: Never true  Transportation Needs: No Transportation Needs (12/19/2018)   PRAPARE - Hydrologist (Medical): No    Lack of Transportation (Non-Medical): No  Physical Activity: Inactive (12/19/2018)   Exercise Vital Sign    Days of Exercise per Week: 0 days    Minutes of Exercise per Session: 0 min  Stress: Stress Concern Present (12/19/2018)   [place]    Feeling of Stress : Very much  Social Connections: Somewhat Isolated (12/19/2018)   Social Connection and Isolation Panel [NHANES]    Frequency of Communication with Friends and Family: More than three times a week    Frequency of Social Gatherings with Friends and Family: More than three times a week    Attends Religious Services: More than 4 times per year    Active Member of [employer] or Organizations: No    Attends Archivist  Meetings: Never    Marital Status: Widowed  Intimate Partner Violence: Not At Risk (12/19/2018)   Humiliation, Afraid, Rape, and Kick questionnaire    Fear of Current or Ex-Partner: No    Emotionally Abused: No    Physically Abused: No    Sexually Abused: No    Past Surgical History:  Procedure Laterality Date   CATARACT EXTRACTION     CATARACT EXTRACTION W/PHACO Left 03/28/2019   Procedure: CATARACT EXTRACTION PHACO AND INTRAOCULAR LENS PLACEMENT (IOC) LEFT  01:03.9  15.0%  9.59;  Surgeon: Birder Robson, MD;  Location: [facility];  Service: Ophthalmology;  Laterality: Left;   COLONOSCOPY  11/08/2006   COLONOSCOPY W/ POLYPECTOMY  04/06/2014   adenomatous polyps, family history of colon polyps   COLONOSCOPY WITH PROPOFOL N/A 11/10/2017   Procedure: COLONOSCOPY WITH PROPOFOL;  Surgeon: Manya Silvas, MD;  Location: [facility] ENDOSCOPY;  Service: Endoscopy;  Laterality: N/A;   TONSILLECTOMY     and adenoidectomy   vocal cord polypectomy  2012    Family History  Problem Relation Age of Onset   Alcohol abuse Brother    Drug abuse Brother    Breast cancer Neg Hx     Allergies  Allergen Reactions   Codeine Nausea Only   Mirtazapine Other (See Comments)    Didn't work Other reaction(s): Dizziness, Other (See Comments) Didn't work   Sulfa Antibiotics Other (See Comments)   Tramadol Other (See Comments)    Feels bad   Benzodiazepines Anxiety       Latest Ref Rng & Units 02/20/2022    3:32 PM  CBC  WBC 4.0 - 10.5 K/uL 10.1   Hemoglobin 12.0 - 15.0 g/dL 14.4   Hematocrit 36.0 - 46.0 % 44.5   Platelets 150 - 400 K/uL 152       CMP     Component Value Date/Time   NA 139 02/20/2022 1532   K 3.9 02/20/2022 1532   CL 105 02/20/2022 1532   CO2 24 02/20/2022 1532   GLUCOSE 135 (H) 02/20/2022 1532   BUN 25 (H) 02/20/2022 1532   CREATININE 0.94 02/20/2022 1532   CALCIUM 9.1 02/20/2022 1532   GFRNONAA >60 02/20/2022 1532     VAS [place] ABI WITH/WO TBI  Result  Date: 02/09/2022  [place] STUDY Patient Name:  Kristen Potts  Date of Exam:   02/05/2022 Medical Rec #: [id number]      Accession #:    [id number] Date of Birth: September 03, 1945  Patient Gender: F Patient Age:   4 years Exam Location:  [facility] Procedure:      VAS [place] ABI WITH/WO TBI Referring Phys: --------------------------------------------------------------------------------  Indications: Claudication.  Performing Technologist: Concha Norway RVT  Examination Guidelines: A complete evaluation includes at minimum, Doppler waveform signals and systolic blood pressure reading at the level of bilateral brachial, anterior tibial, and posterior tibial arteries, when vessel segments are accessible. Bilateral testing is considered an integral part of a complete examination. Photoelectric Plethysmograph (PPG) waveforms and toe systolic pressure readings are included as required and additional duplex testing as needed. Limited examinations for reoccurring indications may be performed as noted.  ABI Findings: +---------+------------------+-----+--------+--------+ Right    Rt Pressure (mmHg)IndexWaveformComment  +---------+------------------+-----+--------+--------+ Brachial 161                                     +---------+------------------+-----+--------+--------+ ATA      149               0.88 biphasic         +---------+------------------+-----+--------+--------+ PTA      168               0.99 biphasic         +---------+------------------+-----+--------+--------+ Great Toe122               0.72 Normal           +---------+------------------+-----+--------+--------+ +---------+------------------+-----+--------+-------+ Left     Lt Pressure (mmHg)IndexWaveformComment +---------+------------------+-----+--------+-------+ Brachial 170                                    +---------+------------------+-----+--------+-------+ ATA      165                0.97 biphasic        +---------+------------------+-----+--------+-------+ PTA      169               0.99 biphasic        +---------+------------------+-----+--------+-------+ Great Toe111               0.65 Abnormal        +---------+------------------+-----+--------+-------+ +-------+-----------+-----------+------------+------------+ ABI/TBIToday's ABIToday's TBIPrevious ABIPrevious TBI +-------+-----------+-----------+------------+------------+ Right  .99        .72        .68         .39          +-------+-----------+-----------+------------+------------+ Left   .99        .65        .66         .52          +-------+-----------+-----------+------------+------------+ Bilateral ABIs and TBIs appear increased compared to prior study on 04/04/2021.  Summary: Right: Resting right ankle-brachial index is within normal range. No evidence of significant right lower extremity arterial disease. The right toe-brachial index is normal. Left: Resting left ankle-brachial index is within normal range. No evidence of significant left lower extremity arterial disease. The left toe-brachial index is abnormal. *See table(s) above for measurements and observations.  Electronically signed by Leotis Pain MD on 02/09/2022 at 9:33:03 AM.    Final        Assessment & Plan:   1. PAD (peripheral artery disease) (HCC)  Recommend:  The patient has evidence of atherosclerosis of the lower extremities with claudication.  The patient  does not voice lifestyle limiting changes at this point in time.  Noninvasive studies do not suggest clinically significant change.  No invasive studies, angiography or surgery at this time The patient should continue walking and begin a more formal exercise program.  The patient should continue antiplatelet therapy and aggressive treatment of the lipid abnormalities  No changes in the patient's medications at this time  Continued surveillance is indicated as  atherosclerosis is likely to progress with time.    The patient will continue follow up with noninvasive studies as ordered.    2. Hyperlipidemia, mixed Continue statin as ordered and reviewed, no changes at this time    Current Outpatient Medications on File Prior to Visit  Medication Sig Dispense Refill   alendronate (FOSAMAX) 70 MG tablet TAKE 1 TABLET BY MOUTH WEEKLY THE SAME DAY OF EACH WEEK. TAKE FIRST THING WITH A FULL GLASS OF WATER.     amitriptyline (ELAVIL) 25 MG tablet Take 50 mg by mouth at bedtime.     amLODipine (NORVASC) 5 MG tablet TAKE 1 TABLET BY MOUTH DAILY     Bioflavonoid Products (BIOFLEX PO) Take by mouth daily.     Calcium Carbonate-Vitamin D 600-200 MG-UNIT TABS Take by mouth.     clonazePAM (KLONOPIN) 1 MG tablet Take 1 mg by mouth daily.     etodolac (LODINE) 400 MG tablet Take 400 mg by mouth every morning.     fluticasone (FLONASE) 50 MCG/ACT nasal spray Place into the nose.     Omega-3 Fatty Acids (FISH OIL) 500 MG CAPS Take by mouth.     omeprazole (PRILOSEC) 20 MG capsule TAKE 1 CAPSULE BY MOUTH USUALLY 30 MINUTES BEFORE BREAKFAST     rosuvastatin (CRESTOR) 5 MG tablet Take 5 mg by mouth daily.     sertraline (ZOLOFT) 50 MG tablet Take 50 mg by mouth daily.     vitamin B-12 (CYANOCOBALAMIN) 500 MCG tablet Take by mouth.     Vitamin E 400 units TABS Take by mouth daily.     HYDROcodone-acetaminophen (NORCO/VICODIN) 5-325 MG tablet Take 1 tablet by mouth every 6 (six) hours as needed for moderate pain. (Patient not taking: Reported on 04/01/2021)     lovastatin (MEVACOR) 40 MG tablet Take 40 mg by mouth at bedtime. (Patient not taking: Reported on 04/01/2021)     meloxicam (MOBIC) 7.5 MG tablet TAKE 1 TABLET BY MOUTH DAILY (Patient not taking: Reported on 04/01/2021)     No current facility-administered medications on file prior to visit.    There are no Patient Instructions on file for this visit. No follow-ups on file.   Kris Hartmann, NP

## 2022-02-25 ENCOUNTER — Telehealth: Payer: Self-pay

## 2022-02-25 NOTE — Telephone Encounter (Signed)
-----   Message from Peggyann Shoals sent at 02/25/2022  1:31 PM EDT ----- Regarding: referral Claiborne Billings with [facility] calling [phone] Urgent referral from [place] at [facility], can this patient be worked in this week? She had a MRI at [facility]. She has lower extremity weakness which is causing her to fall a lot. Injections have not helped either.

## 2022-02-25 NOTE — Telephone Encounter (Signed)
Patient confirmed appt for 02/27/2022.

## 2022-02-26 NOTE — Progress Notes (Signed)
Referring Physician:  Rusty Aus, MD [facility] [place],  [place] [id number]  Primary Physician:  Rusty Aus, MD  History of Present Illness: 02/27/2022 [name] is here today with a chief complaint of  bilateral low back pain with radiation to the left lateral thigh and calf into her ankle with some weakness in the left leg.   She has had multiple falls recently.  She has been having pain for 3 months.  All activities make it worse.  10 out of 10 pain down her left leg in an L5 distribution.   Bowel/Bladder Dysfunction: none  Conservative measures:  Physical therapy: has not participated in Multimodal medical therapy including regular antiinflammatories: advil, meloxicam, oxycodone  Injections: has not received any epidural steroid injections  Past Surgery: denies   Kristen Potts has no symptoms of cervical myelopathy.  The symptoms are causing a significant impact on the patient's life.   Review of Systems:  A 10 point review of systems is negative, except for the pertinent positives and negatives detailed in the HPI.  Past Medical History: Past Medical History:  Diagnosis Date   Anxiety    Arthritis    knees   COPD (chronic obstructive pulmonary disease) ([place])    Depression    GERD (gastroesophageal reflux disease)    Glaucoma    Hyperlipidemia    Hypertension    Vocal cord polyp    Wears dentures    full upper and lower    Past Surgical History: Past Surgical History:  Procedure Laterality Date   CATARACT EXTRACTION     CATARACT EXTRACTION W/PHACO Left 03/28/2019   Procedure: CATARACT EXTRACTION PHACO AND INTRAOCULAR LENS PLACEMENT (IOC) LEFT  01:03.9  15.0%  9.59;  Surgeon: Birder Robson, MD;  Location: [facility];  Service: Ophthalmology;  Laterality: Left;   COLONOSCOPY  11/08/2006   COLONOSCOPY W/ POLYPECTOMY  04/06/2014   adenomatous polyps, family history of colon polyps    COLONOSCOPY WITH PROPOFOL N/A 11/10/2017   Procedure: COLONOSCOPY WITH PROPOFOL;  Surgeon: Manya Silvas, MD;  Location: [facility] ENDOSCOPY;  Service: Endoscopy;  Laterality: N/A;   TONSILLECTOMY     and adenoidectomy   vocal cord polypectomy  2012    Allergies: Allergies as of 02/27/2022 - Review Complete 02/27/2022  Allergen Reaction Noted   Codeine Nausea Only 03/19/2014   Mirtazapine Other (See Comments) 02/18/2016   Sulfa antibiotics Other (See Comments) 03/19/2014   Tramadol Other (See Comments) 03/19/2014   Benzodiazepines Anxiety 08/16/2017    Medications: Current Meds  Medication Sig   alendronate (FOSAMAX) 70 MG tablet TAKE 1 TABLET BY MOUTH WEEKLY THE SAME DAY OF EACH WEEK. TAKE FIRST THING WITH A FULL GLASS OF WATER.   amitriptyline (ELAVIL) 25 MG tablet Take 50 mg by mouth at bedtime.   amLODipine (NORVASC) 5 MG tablet TAKE 1 TABLET BY MOUTH DAILY   Bioflavonoid Products (BIOFLEX PO) Take by mouth daily.   Calcium Carbonate-Vitamin D 600-200 MG-UNIT TABS Take by mouth.   clonazePAM (KLONOPIN) 1 MG tablet Take 1 mg by mouth daily.   etodolac (LODINE) 400 MG tablet Take 400 mg by mouth every morning.   fluticasone (FLONASE) 50 MCG/ACT nasal spray Place into the nose.   Omega-3 Fatty Acids (FISH OIL) 500 MG CAPS Take by mouth.   omeprazole (PRILOSEC) 20 MG capsule TAKE 1 CAPSULE BY MOUTH USUALLY 30 MINUTES BEFORE BREAKFAST   rosuvastatin (CRESTOR) 5 MG tablet Take 5  mg by mouth daily.   sertraline (ZOLOFT) 50 MG tablet Take 50 mg by mouth daily.   vitamin B-12 (CYANOCOBALAMIN) 500 MCG tablet Take by mouth.   Vitamin E 400 units TABS Take by mouth daily.    Social History: Social History   Tobacco Use   Smoking status: Every Day    Years: 10.00    Types: Cigarettes   Smokeless tobacco: Never  Vaping Use   Vaping Use: Former  Substance Use Topics   Alcohol use: Not Currently   Drug use: Not Currently    Family Medical History: Family History  Problem  Relation Age of Onset   Alcohol abuse Brother    Drug abuse Brother    Breast cancer Neg Hx     Physical Examination: Vitals:   02/27/22 1033  BP: 136/84    General: Patient is well developed, well nourished, calm, collected, and in no apparent distress. Attention to examination is appropriate.  Neck:   Supple.  Full range of motion.  Respiratory: Patient is breathing without any difficulty.  Skin:   She has multiple wounds on her legs and arms due to falls   NEUROLOGICAL:     Awake, alert, oriented to person, place, and time.  Speech is clear and fluent. Fund of knowledge is appropriate.   Cranial Nerves: Pupils equal round and reactive to light.  Facial tone is symmetric.  Facial sensation is symmetric. Shoulder shrug is symmetric. Tongue protrusion is midline.  There is no pronator drift.  ROM of spine: full.    Strength: Side Biceps Triceps Deltoid Interossei Grip Wrist Ext. Wrist Flex.  R '5 5 5 5 5 5 5  '$ L '5 5 5 5 5 5 5   '$ Side Iliopsoas Quads Hamstring PF DF EHL  R '5 5 5 5 5 5  '$ L 5 5 4- 4- 5 4-   Reflexes are 1+ and symmetric at the biceps, triceps, brachioradialis, patella and achilles.   Hoffman's is absent.  Clonus is not present.  Toes are down-going.  Bilateral upper and lower extremity sensation is intact to light touch, with diminished sensation in L5 distribution    No evidence of dysmetria noted. She cannot walk due to pain and weakness.  Medical Decision Making  Imaging: MRI L spine 02/20/22 IMPRESSION: 1.  No acute osseous injury of the lumbar spine. 2. At L4-5 there is a broad-based disc bulge. Severe right facet arthropathy with a inspissated 10 mm right facet intraspinal synovial cyst with mass effect on the intraspinal nerve roots. Mild left facet arthropathy. Severe spinal stenosis. Moderate right foraminal stenosis. 3. At L3-4 there is a broad-based disc bulge. Mild bilateral facet arthropathy. Bilateral lateral recess stenosis. Moderate  left foraminal stenosis. 4. At L2-3 there is a broad-based disc bulge. Mild spinal stenosis.     Electronically Signed   By: Kathreen Devoid M.D.   On: 02/20/2022 21:25  I have personally reviewed the images and agree with the above interpretation.  Assessment and Plan: Ms. Back is a pleasant 76 y.o. female with severe left leg pain due to lumbar radiculopathy from neurogenic claudication and severe stenosis at L4-5.  She has weakness in her left leg and has had multiple falls.    At this point, I think she is at too high risk from her continued falls to move forward with conservative management.  She also has weakness in her left leg.  Thus, I recommended intervention with an L4-5 decompression.  Given her open  wounds, I have expressed to her that she has some risk of infection.  I have asked her to discontinue smoking immediately if she would like me to move forward with surgical intervention.  I spent a total of 30 minutes in face-to-face and non-face-to-face activities related to this patient's care today.  Thank you for involving me in the care of this patient.      Terrin Imparato K. Izora Ribas MD, [facility] Neurosurgery

## 2022-02-27 ENCOUNTER — Encounter: Payer: Self-pay | Admitting: Neurosurgery

## 2022-02-27 ENCOUNTER — Ambulatory Visit (INDEPENDENT_AMBULATORY_CARE_PROVIDER_SITE_OTHER): Payer: Medicare HMO | Admitting: Neurosurgery

## 2022-02-27 ENCOUNTER — Other Ambulatory Visit: Payer: Self-pay

## 2022-02-27 VITALS — BP 136/84 | Ht 62.99 in | Wt 181.0 lb

## 2022-02-27 DIAGNOSIS — R29898 Other symptoms and signs involving the musculoskeletal system: Secondary | ICD-10-CM

## 2022-02-27 DIAGNOSIS — M7138 Other bursal cyst, other site: Secondary | ICD-10-CM

## 2022-02-27 DIAGNOSIS — M5416 Radiculopathy, lumbar region: Secondary | ICD-10-CM

## 2022-02-27 DIAGNOSIS — M48062 Spinal stenosis, lumbar region with neurogenic claudication: Secondary | ICD-10-CM | POA: Diagnosis not present

## 2022-02-27 DIAGNOSIS — Z01818 Encounter for other preprocedural examination: Secondary | ICD-10-CM

## 2022-02-27 NOTE — Patient Instructions (Signed)
Please see below for information in regards to your upcoming surgery:  Planned surgery: L4-5 Decompression   Surgery date: you will find out your arrival time the business day before your surgery.   NSAIDS (Non-steroidal anti-inflammatory drugs): because you are having a fusion, no NSAIDS (such as ibuprofen, aleve, naproxen, meloxicam, diclofenac) for 3 months after surgery. Celebrex is an exception. Tylenol is ok because it is not an NSAID.   Pre-op appointment at [facility]: we will call you with a date/time for this. Pre-admit testing is located on the first floor of the [facility], [place], [address].   Pre-op labs may be done at your pre-op appointment. You are not required to fast for these labs.    Should you need to change your pre-op appointment, please call Pre-admit testing at [phone].     If you have FMLA/disability paperwork, please drop it off or fax it to [phone], attention [place].   If you have any questions/concerns before or after surgery, you can reach [place] at [phone], or you can send a mychart message. If you have a concern after hours that cannot wait until normal business hours, you can call [phone] or [phone] and ask the answering service to page the neurosurgeon on call.   Appointments/FMLA & disability paperwork: [place] Nurse: Ophelia Shoulder  Medical assistant: Raquel Sarna Physician Assistant's: Cooper Render & Geronimo Boot Surgeon: Meade Maw, MD

## 2022-03-03 ENCOUNTER — Encounter: Payer: Self-pay | Admitting: Urgent Care

## 2022-03-03 ENCOUNTER — Other Ambulatory Visit: Payer: Medicare HMO

## 2022-03-03 ENCOUNTER — Encounter
Admission: RE | Admit: 2022-03-03 | Discharge: 2022-03-03 | Disposition: A | Discharge status: Home / Self Care | Payer: Medicare HMO | Source: Ambulatory Visit | Attending: Neurosurgery | Admitting: Neurosurgery

## 2022-03-03 VITALS — BP 126/82 | HR 77 | Resp 20 | Ht 67.0 in | Wt 180.0 lb

## 2022-03-03 DIAGNOSIS — M7138 Other bursal cyst, other site: Secondary | ICD-10-CM | POA: Diagnosis not present

## 2022-03-03 DIAGNOSIS — M5416 Radiculopathy, lumbar region: Secondary | ICD-10-CM | POA: Diagnosis not present

## 2022-03-03 DIAGNOSIS — R350 Frequency of micturition: Secondary | ICD-10-CM | POA: Insufficient documentation

## 2022-03-03 DIAGNOSIS — E782 Mixed hyperlipidemia: Secondary | ICD-10-CM

## 2022-03-03 DIAGNOSIS — R8290 Unspecified abnormal findings in urine: Secondary | ICD-10-CM

## 2022-03-03 DIAGNOSIS — Z01818 Encounter for other preprocedural examination: Secondary | ICD-10-CM | POA: Insufficient documentation

## 2022-03-03 DIAGNOSIS — R300 Dysuria: Secondary | ICD-10-CM | POA: Diagnosis not present

## 2022-03-03 DIAGNOSIS — R000 Tachycardia, unspecified: Secondary | ICD-10-CM | POA: Insufficient documentation

## 2022-03-03 DIAGNOSIS — I493 Ventricular premature depolarization: Secondary | ICD-10-CM | POA: Diagnosis not present

## 2022-03-03 DIAGNOSIS — Z01812 Encounter for preprocedural laboratory examination: Secondary | ICD-10-CM

## 2022-03-03 DIAGNOSIS — R399 Unspecified symptoms and signs involving the genitourinary system: Secondary | ICD-10-CM

## 2022-03-03 LAB — BASIC METABOLIC PANEL
Anion gap: 9 (ref 5–15)
BUN: 12 mg/dL (ref 8–23)
CO2: 27 mmol/L (ref 22–32)
Calcium: 9.1 mg/dL (ref 8.9–10.3)
Chloride: 100 mmol/L (ref 98–111)
Creatinine, Ser: 0.94 mg/dL (ref 0.44–1.00)
GFR, Estimated: >60 mL/min (ref >60)
Glucose, Bld: 121 mg/dL — ABNORMAL HIGH (ref 70–99)
Potassium: 3.6 mmol/L (ref 3.5–5.1)
Sodium: 136 mmol/L (ref 135–145)

## 2022-03-03 LAB — SURGICAL PCR SCREEN
MRSA, PCR: NEGATIVE
Staphylococcus aureus: NEGATIVE

## 2022-03-03 LAB — URINALYSIS, ROUTINE W REFLEX MICROSCOPIC
Bacteria, UA: NONE SEEN
Bilirubin Urine: NEGATIVE
Glucose, UA: NEGATIVE mg/dL
Hgb urine dipstick: NEGATIVE
Ketones, ur: NEGATIVE mg/dL
Nitrite: NEGATIVE
Protein, ur: 30 mg/dL — AB
Specific Gravity, Urine: 1.027 (ref 1.005–1.030)
pH: 5 (ref 5.0–8.0)

## 2022-03-03 LAB — CBC
HCT: 39 % (ref 36.0–46.0)
Hemoglobin: 12.6 g/dL (ref 12.0–15.0)
MCH: 30 pg (ref 26.0–34.0)
MCHC: 32.3 g/dL (ref 30.0–36.0)
MCV: 92.9 fL (ref 80.0–100.0)
Platelets: 242 10*3/uL (ref 150–400)
RBC: 4.2 MIL/uL (ref 3.87–5.11)
RDW: 13.2 % (ref 11.5–15.5)
WBC: 8.4 10*3/uL (ref 4.0–10.5)
nRBC: 0 % (ref 0.0–0.2)

## 2022-03-03 LAB — TYPE AND SCREEN
ABO/RH(D): A POS
Antibody Screen: NEGATIVE

## 2022-03-03 MED ORDER — CHLORHEXIDINE GLUCONATE 0.12 % MT SOLN: 15.0000 mL | Freq: Once | OROMUCOSAL | Status: DC

## 2022-03-03 MED ORDER — ORAL CARE MOUTH RINSE: 15.0000 mL | Freq: Once | OROMUCOSAL | Status: DC

## 2022-03-03 MED ORDER — LACTATED RINGERS IV SOLN: INTRAVENOUS | Status: DC

## 2022-03-03 MED ORDER — CEFAZOLIN SODIUM-DEXTROSE 2-4 GM/100ML-% IV SOLN
2.0000 g | Freq: Once | INTRAVENOUS | Status: DC
  Filled 2022-03-03: qty 100

## 2022-03-03 NOTE — Progress Notes (Signed)
Perioperative Services Pre-Admission/Anesthesia Testing    Date: 03/03/22  Name: Kristen Potts MRN:   [id number]  Re: Preoperative ECG (irregular tachycardia) and plans for surgery  Planned Surgical Procedure(s):    Case: [id number] Date/Time: 03/04/22 1228   Procedure: L4-5 DECOMPRESSION   Anesthesia type: General   Pre-op diagnosis:      M54.16-Lumbar radiculopathy     M71.38- Synovial cyst of lumbar facet joint      R29.898-Deficiencies of limbs   Location: [facility] OR ROOM 03 / [place] ORS FOR ANESTHESIA GROUP   Surgeons: Meade Maw, MD   Clinical Notes:  Patient is scheduled for the above procedure on 03/04/2022 with Dr. Meade Maw, MD. In preparation for the procedure, patient presented to the PAT clinic on 10/01/2021 for preoperative interview, labs, and ECG.  In review of her ECG, patient noted to be in sinus tachycardia with occasional PVCs at a rate of 128 bpm.  Serial EKGs were performed x 3 with improvement in her rate to 114 bpm. Patient was left on cardiac monitor while in clinic and rates of as high as 150 bpm were observed by myself and PAT RN.  Patient denies any associated symptoms; no chest pain, shortness of breath, diaphoresis, nausea, vomiting, LEFT shoulder/subscapular/upper extremity pain.  Patient denies any known history of significant cardiovascular diagnoses.  Of note, patient was seen in ED on 02/20/2022; notes reviewed.  Patient was seen for complaints of back pain.  CBC and chemistries were normal.  Urine was nitrite positive with trace LE, 11-20 WBCs/hpf, and rare bacteria.  No culture was performed.  Patient subsequently seen by her PCP Sabra Heck, MD) on 02/25/2022, at which time she was started on cephalexin 500 mg 3 times daily x7 days for a skin tear on her leg.  At the time of her visit to PAT, patient was still on prescribed antimicrobial therapy; finishes 03/04/2022. Patient reports urinary frequency, urgency, and minor dysuria. She denies fevers,  nausea, vomiting, or abdominal pain. Patient experiencing suprapubic "pressure". Difficult to determine whether she is having an urinary related back pain given her current spinal stenosis with associated radiculopathy and claudication pain.   Reached out to primary attending surgeon Izora Ribas, MD) and attending anesthesiologist on call Erenest Rasher, MD) to discuss. Physicians were made aware of the aforementioned case details. Discussed that labs were being repeated today. Rechecking CBC to look at WBC and H&H. Rechecking BMP to look at electrolytes and renal function. Urine being repeated to assess for progression vs clearing in the setting of recent 7 day cephalexin course. During process of urine collection, PAT staff reported to me that patient with intertriginous erythema beneath her breasts and in her groin. Also, nurse noted pitting edema on exam that I was made aware of after patient left clinic.   Impression and Plan:  Kristen Potts noted to be tachycardiac on exam. She is on antibiotics for skin tear.  Labs and urine rechecked today; results pending. Patient only able to provide approximately 5-10 cc of concentrated, cloudy, and malodorous urine. Patient reports that it "really hurt her to pee" this time. Results reviewed. UA has improved overall; no need for further treatment. I have called patient to update on her on repeat lab results and plan of care as it stands at this point.  No change to surgery schedule. Per direction of Dr. Erenest Rasher and Dr. Izora Ribas, will plan on sending Dr. Amie Critchley a message regarding today's events to determine whether or not case will proceed.  Final disposition is being  left to attending anesthesiologist, however as it stands now, patient plans on having procedure as scheduled.   Honor Loh, MSN, APRN, FNP-C, CEN [facility]  Peri-operative Services Nurse Practitioner Phone: [phone] 03/03/22 5:31 PM  NOTE: This note has been prepared  using Dragon dictation software. Despite my best ability to proofread, there is always the potential that unintentional transcriptional errors may still occur from this process.

## 2022-03-03 NOTE — Patient Instructions (Addendum)
Your procedure is scheduled on: 03/04/22 Report to [facility] at 11:30 [place]. Check in at the admitting desk at 11:15  Remember: Instructions that are not followed completely may result in serious medical risk, up to and including death, or upon the discretion of your surgeon and anesthesiologist your surgery may need to be rescheduled.     _X__ 1. Do not eat food after midnight the night before your procedure.                 No gum chewing or hard candies. You may drink clear liquids up to 2 hours                 before you are scheduled to arrive for your surgery- DO not drink clear                 liquids within 2 hours of the start of your surgery.                 Clear Liquids include:  water, apple juice without pulp, clear carbohydrate                 drink such as Clearfast or Gatorade, Black Coffee or Tea (Do not add                 anything to coffee or tea). Diabetics water only  __X__2.  On the morning of surgery brush your teeth with toothpaste and water, you                 may rinse your mouth with mouthwash if you wish.  Do not swallow any              toothpaste of mouthwash.     _X__ 3.  No Alcohol for 24 hours before or after surgery.   _X__ 4.  Do Not Smoke or use e-cigarettes For 24 Hours Prior to Your Surgery.                 Do not use any chewable tobacco products for at least 6 hours prior to                 surgery.  ____  5.  Bring all medications with you on the day of surgery if instructed.   __X__  6.  Notify your doctor if there is any change in your medical condition      (cold, fever, infections).     Do not wear jewelry, make-up, hairpins, clips or nail polish. Do not wear lotions, powders, or perfumes.  Do not shave body shave 48 hours prior to surgery. Men may shave face and neck. Do not bring valuables to the hospital.    [facility] is not responsible for any belongings or valuables.  Contacts,  dentures/partials or body piercings may not be worn into surgery. Bring a case for your contacts, glasses or hearing aids, a denture cup will be supplied. Leave your suitcase in the car. After surgery it may be brought to your room. For patients admitted to the hospital, discharge time is determined by your treatment team.   Patients discharged the day of surgery will not be allowed to drive home.   Please read over the following fact sheets that you were given:   MRSA Information, SAGE wipes  __X__ Take these medicines the morning of surgery with A SIP OF WATER:    1.  omeprazole (PRILOSEC) 20 MG capsule  2. Oxycodone if needed  3.   4.  5.  6.  ____ Fleet Enema (as directed)   __X__ Use CHG Soap/SAGE wipes as directed  ____ Use inhalers on the day of surgery  ____ Stop metformin/Janumet/Farxiga 2 days prior to surgery    ____ Take 1/2 of usual insulin dose the night before surgery. No insulin the morning          of surgery.   ____ Stop Blood Thinners Coumadin/Plavix/Xarelto/Pleta/Pradaxa/Eliquis/Effient/Aspirin  on   Or contact your Surgeon, Cardiologist or Medical Doctor regarding  ability to stop your blood thinners  __X__ Stop Anti-inflammatories 7 days before surgery such as Advil, Ibuprofen, Motrin,  BC or Goodies Powder, Naprosyn, Naproxen, Aleve, Aspirin    __X__ Stop all herbals and supplements, fish oil or vitamins  until after surgery.    ____ Bring C-Pap to the hospital.    Hold the Lodine and your vitamins until after your surgery

## 2022-03-04 ENCOUNTER — Telehealth: Payer: Self-pay

## 2022-03-04 ENCOUNTER — Ambulatory Visit: Admission: RE | Admit: 2022-03-04 | Payer: Medicare HMO | Source: Home / Self Care | Admitting: Neurosurgery

## 2022-03-04 ENCOUNTER — Encounter: Admission: RE | Payer: Self-pay | Source: Home / Self Care

## 2022-03-04 DIAGNOSIS — Z01818 Encounter for other preprocedural examination: Secondary | ICD-10-CM

## 2022-03-04 LAB — URINE CULTURE: Culture: NO GROWTH

## 2022-03-04 SURGERY — LUMBAR LAMINECTOMY/DECOMPRESSION MICRODISCECTOMY 1 LEVEL
Anesthesia: General

## 2022-03-04 NOTE — Telephone Encounter (Signed)
Per discussion with Honor Loh, NP surgery has been canceled until pt is cleared by PCP and cardiology. Gaspar Bidding has sent a notification to Dr Sabra Heck. Dr Amie Critchley discussed this with Ms Zaccaro this morning. Post op appts have been canceled. We will reschedule pt once she is cleared.

## 2022-03-11 ENCOUNTER — Telehealth: Payer: Self-pay

## 2022-03-11 ENCOUNTER — Other Ambulatory Visit: Payer: Self-pay

## 2022-03-11 DIAGNOSIS — Z01818 Encounter for other preprocedural examination: Secondary | ICD-10-CM

## 2022-03-11 NOTE — Telephone Encounter (Signed)
I spoke with Kristen Potts. We will plan for surgery on 04/08/22. She states she is not on any blood thinners, aspirin, or diabetes/weight loss medications. She would like to be moved up to a sooner date if one becomes available.

## 2022-03-11 NOTE — Telephone Encounter (Signed)
Pt called in today to notify us that she has been cleared for surgery by Dr Sabra Heck and Dr Saralyn Pilar. Notes in her chart confirm this.

## 2022-03-11 NOTE — Telephone Encounter (Signed)
Mailed appts to her.

## 2022-03-13 ENCOUNTER — Telehealth: Payer: Self-pay

## 2022-03-13 NOTE — Telephone Encounter (Signed)
I contacted Kristen Potts and offered to move her surgery date up to 03/25/22. She was agreeable and appreciative of this.

## 2022-03-16 NOTE — Telephone Encounter (Signed)
Patient aware of phone post-op on 9/14 and I mailed her all her new post-ops. Kristen Potts pt's sister calling. Kristen Potts is saying that she received a call today about a specimen. Now they are worried she can not have her surgery. Can you call [name] back to confirm that there are not changes with her surgery. [phone]

## 2022-03-16 NOTE — Telephone Encounter (Signed)
I have not called her regarding any specimen. It appears Dr Ammie Ferrier office has been in touch with them regarding an appointment for a repeat urine (I see several telephone calls in her chart). Per Dr Ammie Ferrier notes, she is cleared for surgery, but they want to repeat her urine. Her surgery is still scheduled for 03/25/22, but she needs to do what Dr Sabra Heck is requesting.

## 2022-03-16 NOTE — Telephone Encounter (Signed)
Kristen Potts was notified that we were not the ones trying to reach the patient. She should check with her PCP Dr.Miller.

## 2022-03-19 ENCOUNTER — Encounter
Admission: RE | Admit: 2022-03-19 | Discharge: 2022-03-19 | Disposition: A | Discharge status: Home / Self Care | Payer: Medicare HMO | Source: Ambulatory Visit | Attending: Neurosurgery | Admitting: Neurosurgery

## 2022-03-19 ENCOUNTER — Encounter: Payer: Medicare HMO | Admitting: Orthopedic Surgery

## 2022-03-19 DIAGNOSIS — Z01812 Encounter for preprocedural laboratory examination: Secondary | ICD-10-CM

## 2022-03-19 NOTE — Pre-Procedure Instructions (Signed)
Pre admission orders updated as patients surgery has been rescheduled from 03/04/22 to 03/25/22, medications updated and patient will come by PAT to pick up her instructions and her wipes. No questions or concerns voiced at this time.

## 2022-03-19 NOTE — Patient Instructions (Signed)
Your procedure is scheduled on: 03/25/22 - Wednesday Report to the Registration Desk on the 1st floor of the [facility]. To find out your arrival time, please call [phone] between 1PM - 3PM on: 03/24/22 - Tuesday If your arrival time is 6:00 am, do not arrive prior to that time as the [place] entrance doors do not open until 6:00 am.  REMEMBER: Instructions that are not followed completely may result in serious medical risk, up to and including death; or upon the discretion of your surgeon and anesthesiologist your surgery may need to be rescheduled.  Do not eat food after midnight the night before surgery.  No gum chewing, lozengers or hard candies.  You may however, drink CLEAR liquids up to 2 hours before you are scheduled to arrive for your surgery. Do not drink anything within 2 hours of your scheduled arrival time.  Clear liquids include: - water  - apple juice without pulp - gatorade (not RED colors) - black coffee or tea (Do NOT add milk or creamers to the coffee or tea) Do NOT drink anything that is not on this list.  TAKE THESE MEDICATIONS THE MORNING OF SURGERY WITH A SIP OF WATER: - fluticasone (FLONASE) - omeprazole (PRILOSEC)- (take one the night before and one on the morning of surgery - helps to prevent nausea after surgery.) - oxycodone (OXY-IR) if needed.  One week prior to surgery: Stop Anti-inflammatories (NSAIDS) such as Advil, Aleve, Ibuprofen, Motrin, Naproxen, Naprosyn and Aspirin based products such as Excedrin, Goodys Powder, BC Powder.  Stop ANY OVER THE COUNTER supplements until after surgery.  You may however, continue to take Tylenol if needed for pain up until the day of surgery.  No Alcohol for 24 hours before or after surgery.  No Smoking including e-cigarettes for 24 hours prior to surgery.  No chewable tobacco products for at least 6 hours prior to surgery.  No nicotine patches on the day of surgery.  Do not use any "recreational"  drugs for at least a week prior to your surgery.  Please be advised that the combination of cocaine and anesthesia may have negative outcomes, up to and including death. If you test positive for cocaine, your surgery will be cancelled.  On the morning of surgery brush your teeth with toothpaste and water, you may rinse your mouth with mouthwash if you wish. Do not swallow any toothpaste or mouthwash.  Use CHG Soap or wipes as directed on instruction sheet.  Do not wear jewelry, make-up, hairpins, clips or nail polish.  Do not wear lotions, powders, or perfumes.   Do not shave body from the neck down 48 hours prior to surgery just in case you cut yourself which could leave a site for infection.  Also, freshly shaved skin may become irritated if using the CHG soap.  Contact lenses, hearing aids and dentures may not be worn into surgery.  Do not bring valuables to the hospital. [facility] is not responsible for any missing/lost belongings or valuables.   Notify your doctor if there is any change in your medical condition (cold, fever, infection).  Wear comfortable clothing (specific to your surgery type) to the hospital.  After surgery, you can help prevent lung complications by doing breathing exercises.  Take deep breaths and cough every 1-2 hours. Your doctor may order a device called an Incentive Spirometer to help you take deep breaths. When coughing or sneezing, hold a pillow firmly against your incision with both hands. This is  called "splinting." Doing this helps protect your incision. It also decreases belly discomfort.  If you are being admitted to the hospital overnight, leave your suitcase in the car. After surgery it may be brought to your room.  If you are being discharged the day of surgery, you will not be allowed to drive home. You will need a responsible adult (18 years or older) to drive you home and stay with you that night.   If you are taking public  transportation, you will need to have a responsible adult (18 years or older) with you. Please confirm with your physician that it is acceptable to use public transportation.   Please call the [facility] at [phone] if you have any questions about these instructions.  Surgery Visitation Policy:  Patients undergoing a surgery or procedure may have two family members or support persons with them as long as the person is not COVID-19 positive or experiencing its symptoms.   Inpatient Visitation:    Visiting hours are 7 a.m. to 8 p.m. Up to four visitors are allowed at one time in a patient room, including children. The visitors may rotate out with other people during the day. One designated support person (adult) may remain overnight.

## 2022-03-20 ENCOUNTER — Encounter: Payer: Self-pay | Admitting: Urgent Care

## 2022-03-20 NOTE — Progress Notes (Signed)
[facility] Pre-Admission/Anesthesia Testing    Date: 03/20/22  Name: Kristen Potts MRN:   [id number]  Re: Plans for surgery  Planned Surgical Procedure(s):    Case: [id number] Date/Time: 03/25/22 1153   Procedure: L4-5 DECOMPRESSION   Anesthesia type: General   Pre-op diagnosis:      M54.16-Lumbar radiculopathy     M71.38- Synovial cyst of lumbar facet joint      R29.898-Deficiencies of limbs   Location: [facility] OR ROOM 03 / [place] ORS FOR ANESTHESIA GROUP   Surgeons: Meade Maw, MD   Clinical Notes:  Patient is scheduled for the above procedure on 03/25/2022 with Dr. Meade Maw, MD.  Of note, patient previously scheduled for this procedure on 03/04/2022, however at her PAT visit, patient noted to be tachycardic to the 150s with no other associated symptoms.  Following discussion with anesthesia, patient was referred to her PCP for further evaluation prior to proceeding with her case.  Patient was seen by her PCP on 3 months with follow-up 2023; notes reviewed.  Dr. Emily Filbert noted sinus tachycardia and lower extremity edema.  Given upcoming surgery, patient was started on low-dose metoprolol succinate and furosemide.  No other changes were made to her medication regimen.  Per Dr. Sabra Heck, "adding Toprol XL 25 mg for heart rate and blood pressure control.  No anginal symptoms.  Recent UTI, however follow-up UA negative today.  Adding Lasix for edema as needed.  Patient may proceed with planned intervention at an overall LOW surgical risk". Patient to continue regular follow-ups with her PCP for ongoing care management of her chronic medical comorbidities.  She was scheduled to see cardiology for preoperative clearance.  Patient was seen in the cardiology clinic on 03/10/2022 by Dr. Jonetta Speak; notes reviewed.  At the time of her clinic visit, patient denied any episodes of chest pain, however she had exertional dyspnea related to her underlying COPD  diagnosis.  Patient with chronic peripheral edema.  She denied any episodes of PND, orthopnea, palpitations, vertiginous symptoms, or presyncope/syncope.  Blood pressure was well controlled at 132/78 mmHg.  Patient not diabetic.  No OSAH diagnosis.  Patient sedentary at baseline.  Functional capacity limited by low back pain, extremity weakness, and history of falls with ambulation.  Preoperative ECG was reviewed.  In the absence of symptoms, and with the addition of the added beta-blocker therapy by her PCP, patient was "cleared to proceed with the planned surgical intervention at an overall ACCEPTABLE risk of significant Kristen cardiovascular complications without any further cardiac diagnostics preoperatively". Patient was scheduled to follow-up with cardiology on a as needed basis.  Impression and Plan:  Kristen Potts has been referred for pre-anesthesia review and clearance prior to her undergoing the planned anesthetic and procedural courses. Available labs, pertinent testing, and imaging results were personally reviewed by me. She presented to her initial PAT visit with sinus tachycardia noted on her ECG.  In clinic monitoring revealed rates as high as 150 bpm.  Serial ECGs were obtained; see tracings in CHL. With patient reclined in chair in a quiet room, rate reduced to the 110s. She denied any associated angina/anginal equivalent symptoms; no chest pain, short of breath, diaphoresis, nausea, vomiting, or any pain to her shoulder, subscapular area, or left upper extremity.  Patient has been seen by her PCP and cardiology, and has been appropriately cleared for the planned surgical intervention, which has been rescheduled for 03/25/2022.  Based on clinical review performed today (03/20/22), barring any significant acute changes in the  patient's overall condition, it is anticipated that she will be able to proceed with the planned surgical intervention. Any acute changes in clinical condition may  necessitate her procedure being postponed and/or cancelled. Patient will meet with anesthesia team (MD and/or CRNA) on this day of her procedure for preoperative evaluation/assessment.   Pre-surgical instructions were reviewed with the patient during her PAT appointment and questions were fielded by PAT clinical staff. Staff has called patient to review health history and medication list for any changes; CHL updated.  Pertinent surgical instructions were reviewed with patient again, and all questions were answered to patient's satisfaction.  Patient was advised that if any questions or concerns arise prior to her procedure then she should return a call to PAT and/or her surgeon's office to discuss.  Honor Loh, MSN, APRN, FNP-C, CEN [facility]  Peri-operative Services Nurse Practitioner Phone: [phone] 03/20/22 9:36 AM  NOTE: This note has been prepared using Dragon dictation software. Despite my best ability to proofread, there is always the potential that unintentional transcriptional errors may still occur from this process.

## 2022-03-25 ENCOUNTER — Telehealth: Payer: Self-pay

## 2022-03-25 ENCOUNTER — Encounter
Admission: RE | Disposition: A | Discharge status: Home / Self Care | Payer: Self-pay | Source: Home / Self Care | Attending: Neurosurgery

## 2022-03-25 ENCOUNTER — Encounter: Payer: Self-pay | Admitting: Neurosurgery

## 2022-03-25 ENCOUNTER — Other Ambulatory Visit: Payer: Self-pay

## 2022-03-25 ENCOUNTER — Ambulatory Visit
Admission: RE | Admit: 2022-03-25 | Discharge: 2022-03-25 | Disposition: A | Discharge status: Home / Self Care | Payer: Medicare HMO | Attending: Neurosurgery | Admitting: Neurosurgery

## 2022-03-25 DIAGNOSIS — L03115 Cellulitis of right lower limb: Secondary | ICD-10-CM

## 2022-03-25 DIAGNOSIS — Z538 Procedure and treatment not carried out for other reasons: Secondary | ICD-10-CM | POA: Insufficient documentation

## 2022-03-25 DIAGNOSIS — Z01812 Encounter for preprocedural laboratory examination: Secondary | ICD-10-CM

## 2022-03-25 DIAGNOSIS — Z01818 Encounter for other preprocedural examination: Secondary | ICD-10-CM

## 2022-03-25 DIAGNOSIS — X58XXXA Exposure to other specified factors, initial encounter: Secondary | ICD-10-CM | POA: Insufficient documentation

## 2022-03-25 DIAGNOSIS — M5416 Radiculopathy, lumbar region: Secondary | ICD-10-CM | POA: Insufficient documentation

## 2022-03-25 DIAGNOSIS — M7138 Other bursal cyst, other site: Secondary | ICD-10-CM | POA: Diagnosis not present

## 2022-03-25 DIAGNOSIS — S81801A Unspecified open wound, right lower leg, initial encounter: Secondary | ICD-10-CM | POA: Diagnosis not present

## 2022-03-25 SURGERY — LUMBAR LAMINECTOMY/DECOMPRESSION MICRODISCECTOMY 1 LEVEL
Anesthesia: General

## 2022-03-25 MED ORDER — SODIUM CHLORIDE FLUSH 0.9 % IV SOLN
INTRAVENOUS | Status: AC
  Filled 2022-03-25: qty 20

## 2022-03-25 MED ORDER — BUPIVACAINE HCL (PF) 0.5 % IJ SOLN
INTRAMUSCULAR | Status: AC
  Filled 2022-03-25: qty 30

## 2022-03-25 MED ORDER — CHLORHEXIDINE GLUCONATE 0.12 % MT SOLN: 15.0000 mL | Freq: Once | OROMUCOSAL | Status: DC

## 2022-03-25 MED ORDER — ORAL CARE MOUTH RINSE: 15.0000 mL | Freq: Once | OROMUCOSAL | Status: DC

## 2022-03-25 MED ORDER — CEFAZOLIN SODIUM-DEXTROSE 2-4 GM/100ML-% IV SOLN: 2.0000 g | INTRAVENOUS | Status: DC

## 2022-03-25 MED ORDER — BUPIVACAINE-EPINEPHRINE (PF) 0.5% -1:200000 IJ SOLN
INTRAMUSCULAR | Status: AC
  Filled 2022-03-25: qty 30

## 2022-03-25 MED ORDER — CEFAZOLIN IN SODIUM CHLORIDE 2-0.9 GM/100ML-% IV SOLN
2.0000 g | Freq: Once | INTRAVENOUS | Status: DC
  Filled 2022-03-25: qty 100

## 2022-03-25 MED ORDER — CLINDAMYCIN HCL 300 MG PO CAPS: 300.0000 mg | ORAL_CAPSULE | Freq: Three times a day (TID) | ORAL | 0 refills | Status: AC

## 2022-03-25 MED ORDER — METHYLPREDNISOLONE ACETATE 40 MG/ML IJ SUSP
INTRAMUSCULAR | Status: AC
  Filled 2022-03-25: qty 1

## 2022-03-25 MED ORDER — CEFAZOLIN SODIUM-DEXTROSE 2-4 GM/100ML-% IV SOLN
INTRAVENOUS | Status: AC
  Filled 2022-03-25: qty 100

## 2022-03-25 MED ORDER — BUPIVACAINE LIPOSOME 1.3 % IJ SUSP
INTRAMUSCULAR | Status: AC
  Filled 2022-03-25: qty 20

## 2022-03-25 MED ORDER — LACTATED RINGERS IV SOLN: INTRAVENOUS | Status: DC

## 2022-03-25 MED ORDER — CHLORHEXIDINE GLUCONATE 0.12 % MT SOLN
OROMUCOSAL | Status: AC
  Filled 2022-03-25: qty 15

## 2022-03-25 MED ORDER — FENTANYL CITRATE (PF) 100 MCG/2ML IJ SOLN
INTRAMUSCULAR | Status: AC
  Filled 2022-03-25: qty 2

## 2022-03-25 SURGICAL SUPPLY — 41 items
BASIN KIT SINGLE STR (MISCELLANEOUS) ×1 IMPLANT
BUR NEURO DRILL SOFT 3.0X3.8M (BURR) ×1 IMPLANT
CHLORAPREP W/TINT 26 (MISCELLANEOUS) ×1 IMPLANT
CNTNR SPEC 2.5X3XGRAD LEK (MISCELLANEOUS) ×1
CONT SPEC 4OZ STER OR WHT (MISCELLANEOUS) ×1
CONTAINER SPEC 2.5X3XGRAD LEK (MISCELLANEOUS) ×1 IMPLANT
DERMABOND ADVANCED .7 DNX12 (GAUZE/BANDAGES/DRESSINGS) ×1 IMPLANT
DRAPE C ARM PK CFD 31 SPINE (DRAPES) ×1 IMPLANT
DRAPE LAPAROTOMY 100X77 ABD (DRAPES) ×1 IMPLANT
DRAPE MICROSCOPE SPINE 48X150 (DRAPES) ×1 IMPLANT
DRAPE SURG 17X11 SM STRL (DRAPES) ×1 IMPLANT
ELECT EZSTD 165MM 6.5IN (MISCELLANEOUS)
ELECT REM PT RETURN 9FT ADLT (ELECTROSURGICAL) ×1
ELECTRODE EZSTD 165MM 6.5IN (MISCELLANEOUS) IMPLANT
ELECTRODE REM PT RTRN 9FT ADLT (ELECTROSURGICAL) ×1 IMPLANT
GLOVE BIOGEL PI IND STRL 6.5 (GLOVE) ×1 IMPLANT
GLOVE BIOGEL PI IND STRL 8.5 (GLOVE) ×1 IMPLANT
GLOVE SURG SYN 6.5 ES PF (GLOVE) ×2 IMPLANT
GLOVE SURG SYN 8.5  E (GLOVE) ×3
GLOVE SURG SYN 8.5 E (GLOVE) ×3 IMPLANT
GOWN SRG LRG LVL 4 IMPRV REINF (GOWNS) ×1 IMPLANT
GOWN SRG XL LVL 3 NONREINFORCE (GOWNS) ×1 IMPLANT
GOWN STRL NON-REIN TWL XL LVL3 (GOWNS) ×1
GOWN STRL REIN LRG LVL4 (GOWNS) ×1
KIT SPINAL PRONEVIEW (KITS) ×1 IMPLANT
MANIFOLD NEPTUNE II (INSTRUMENTS) ×1 IMPLANT
MARKER SKIN DUAL TIP RULER LAB (MISCELLANEOUS) ×1 IMPLANT
NDL SAFETY ECLIP 18X1.5 (MISCELLANEOUS) ×1 IMPLANT
NS IRRIG 1000ML POUR BTL (IV SOLUTION) ×1 IMPLANT
PACK LAMINECTOMY NEURO (CUSTOM PROCEDURE TRAY) ×1 IMPLANT
PAD ARMBOARD 7.5X6 YLW CONV (MISCELLANEOUS) ×1 IMPLANT
SURGIFLO W/THROMBIN 8M KIT (HEMOSTASIS) ×1 IMPLANT
SUT DVC VLOC 3-0 CL 6 P-12 (SUTURE) ×1 IMPLANT
SUT VIC AB 0 CT1 27 (SUTURE) ×1
SUT VIC AB 0 CT1 27XCR 8 STRN (SUTURE) ×1 IMPLANT
SUT VIC AB 2-0 CT1 18 (SUTURE) ×1 IMPLANT
SYR 10ML LL (SYRINGE) ×2 IMPLANT
SYR 30ML LL (SYRINGE) ×2 IMPLANT
SYR 3ML LL SCALE MARK (SYRINGE) ×1 IMPLANT
TRAP FLUID SMOKE EVACUATOR (MISCELLANEOUS) ×1 IMPLANT
WATER STERILE IRR 1000ML POUR (IV SOLUTION) ×2 IMPLANT

## 2022-03-25 NOTE — Telephone Encounter (Signed)
Rx sent for clindamycin. HH orders placed for wound care, notified Meg with [facility]. Per Dr Izora Ribas, we will reschedule her surgery to 04/15/22 and see her in the office the week prior.

## 2022-03-25 NOTE — Telephone Encounter (Addendum)
Meg said they will send the orders to Dr Sabra Heck. They are starting her care tomorrow.

## 2022-03-25 NOTE — Discharge Instructions (Signed)
Dr. Rhea Bleacher office will call with instructions related to follow up for wound care. If you do not hear from someone please call Dr. Rhea Bleacher office.

## 2022-03-25 NOTE — Progress Notes (Signed)
Kristen Potts presented with an open draining wound on her RLE extremity with erythema and swelling.  I have asked medicine to evaluate for possible admission for cellulitis.  I will cancel surgery for today, as it is not appropriate to proceed with elective surgery in setting of concern for infected wound.

## 2022-03-25 NOTE — Telephone Encounter (Signed)
Per Dr Izora Ribas: Medicine recommended clindamicin 300 mg TID for 1 week, probiotic BID for 2 weeks, and home health for wound care.  can you arrange for her? he recommended rescheduling in 2-3 weeks

## 2022-03-25 NOTE — Telephone Encounter (Signed)
I spoke with Kristen Potts and notified her that we will see her on 10/5 at 2:45 and that we will plan for surgery on 10/11 as long as her foot is healed.  She is aware that [facility] will start tomorrow.

## 2022-03-25 NOTE — Anesthesia Preprocedure Evaluation (Deleted)
Anesthesia Evaluation  Patient identified by MRN, date of birth, ID band Patient awake    Reviewed: Allergy & Precautions, NPO status , Patient's Chart, lab work & pertinent test results  History of Anesthesia Complications Negative for: history of anesthetic complications  Airway Mallampati: III  TM Distance: >3 FB Neck ROM: Limited    Dental  (+) Upper Dentures, Lower Dentures   Pulmonary COPD, Current Smoker and Patient abstained from smoking.,    breath sounds clear to auscultation       Cardiovascular hypertension, (-) angina(-) DOE  Rhythm:Regular Rate:Normal   HLD   Neuro/Psych PSYCHIATRIC DISORDERS Anxiety Depression    GI/Hepatic GERD  Controlled,  Endo/Other    Renal/GU      Musculoskeletal  (+) Arthritis ,   Abdominal   Peds  Hematology   Anesthesia Other Findings   Reproductive/Obstetrics                             Anesthesia Physical  Anesthesia Plan  ASA: II  Anesthesia Plan: General   Post-op Pain Management: Regional block* and Ofirmev IV (intra-op)*   Induction: Intravenous  PONV Risk Score and Plan: 2 and Ondansetron, Dexamethasone and Treatment may vary due to age or medical condition  Airway Management Planned: Oral ETT  Additional Equipment:   Intra-op Plan:   Post-operative Plan:   Informed Consent:   Plan Discussed with: CRNA and Anesthesiologist  Anesthesia Plan Comments:         Anesthesia Quick Evaluation

## 2022-03-25 NOTE — Progress Notes (Signed)
Due to patient's wounds on bilateral lower extremities, internal medicine MD to assess patient. Surgery cancelled due to wounds.

## 2022-03-30 NOTE — Telephone Encounter (Signed)
All appts mailed

## 2022-03-31 ENCOUNTER — Other Ambulatory Visit: Payer: Medicare HMO

## 2022-04-07 ENCOUNTER — Encounter
Admission: RE | Admit: 2022-04-07 | Discharge: 2022-04-07 | Disposition: A | Discharge status: Home / Self Care | Payer: Medicare HMO | Source: Ambulatory Visit | Attending: Neurosurgery | Admitting: Neurosurgery

## 2022-04-07 DIAGNOSIS — Z01812 Encounter for preprocedural laboratory examination: Secondary | ICD-10-CM

## 2022-04-07 NOTE — Pre-Procedure Instructions (Signed)
pRE ADMISSION UPDATE COMPLETED, ALLERGIES, MEDICATION AND hX REVIEWED, INSTRUCTIONS REVIEWED WITH PATIENT , SHE VOICES THAT HER WOUND HAS HEALED AND SHE HAS COMPLETED HER ANTIBIOTICS FOR HER WOUND. SHE HAS NO QUESTIONS AT THIS TIME.

## 2022-04-07 NOTE — Pre-Procedure Instructions (Signed)
Pre admission orders updated as patients surgery has been rescheduled from 03/04/22 to 03/25/22, medications updated and patient will come by PAT to pick up her instructions and her wipes. No questions or concerns voiced at this time.

## 2022-04-09 ENCOUNTER — Ambulatory Visit (INDEPENDENT_AMBULATORY_CARE_PROVIDER_SITE_OTHER): Payer: Medicare HMO | Admitting: Neurosurgery

## 2022-04-09 ENCOUNTER — Encounter: Payer: Self-pay | Admitting: Neurosurgery

## 2022-04-09 ENCOUNTER — Encounter: Payer: Self-pay | Admitting: Urgent Care

## 2022-04-09 VITALS — BP 130/78 | Ht 67.0 in | Wt 180.0 lb

## 2022-04-09 DIAGNOSIS — L03115 Cellulitis of right lower limb: Secondary | ICD-10-CM

## 2022-04-09 NOTE — Progress Notes (Signed)
Her R foot lesion is much improved.  Plan to proceed with surgeyr next week.

## 2022-04-10 ENCOUNTER — Encounter: Payer: Medicare HMO | Admitting: Orthopedic Surgery

## 2022-04-15 ENCOUNTER — Ambulatory Visit: Payer: Medicare HMO | Admitting: Certified Registered"

## 2022-04-15 ENCOUNTER — Encounter: Payer: Self-pay | Admitting: Neurosurgery

## 2022-04-15 ENCOUNTER — Ambulatory Visit: Payer: Medicare HMO

## 2022-04-15 ENCOUNTER — Observation Stay
Admission: RE | Admit: 2022-04-15 | Discharge: 2022-04-16 | Disposition: A | Discharge status: Home / Self Care | Payer: Medicare HMO | Attending: Neurosurgery | Admitting: Neurosurgery

## 2022-04-15 ENCOUNTER — Other Ambulatory Visit: Payer: Self-pay

## 2022-04-15 ENCOUNTER — Encounter
Admission: RE | Disposition: A | Discharge status: Home / Self Care | Payer: Self-pay | Source: Home / Self Care | Attending: Neurosurgery

## 2022-04-15 DIAGNOSIS — M5416 Radiculopathy, lumbar region: Secondary | ICD-10-CM | POA: Insufficient documentation

## 2022-04-15 DIAGNOSIS — Z01818 Encounter for other preprocedural examination: Secondary | ICD-10-CM

## 2022-04-15 DIAGNOSIS — M48062 Spinal stenosis, lumbar region with neurogenic claudication: Secondary | ICD-10-CM | POA: Diagnosis present

## 2022-04-15 DIAGNOSIS — I10 Essential (primary) hypertension: Secondary | ICD-10-CM | POA: Diagnosis not present

## 2022-04-15 DIAGNOSIS — R29898 Other symptoms and signs involving the musculoskeletal system: Secondary | ICD-10-CM | POA: Diagnosis not present

## 2022-04-15 DIAGNOSIS — M7138 Other bursal cyst, other site: Secondary | ICD-10-CM | POA: Diagnosis not present

## 2022-04-15 DIAGNOSIS — Z79899 Other long term (current) drug therapy: Secondary | ICD-10-CM | POA: Insufficient documentation

## 2022-04-15 DIAGNOSIS — Z01812 Encounter for preprocedural laboratory examination: Secondary | ICD-10-CM

## 2022-04-15 DIAGNOSIS — J449 Chronic obstructive pulmonary disease, unspecified: Secondary | ICD-10-CM | POA: Insufficient documentation

## 2022-04-15 DIAGNOSIS — R29818 Other symptoms and signs involving the nervous system: Secondary | ICD-10-CM | POA: Diagnosis not present

## 2022-04-15 DIAGNOSIS — F17210 Nicotine dependence, cigarettes, uncomplicated: Secondary | ICD-10-CM | POA: Insufficient documentation

## 2022-04-15 DIAGNOSIS — R531 Weakness: Secondary | ICD-10-CM | POA: Diagnosis not present

## 2022-04-15 HISTORY — PX: LUMBAR LAMINECTOMY/DECOMPRESSION MICRODISCECTOMY: SHX5026

## 2022-04-15 LAB — TYPE AND SCREEN
ABO/RH(D): A POS
Antibody Screen: NEGATIVE

## 2022-04-15 SURGERY — LUMBAR LAMINECTOMY/DECOMPRESSION MICRODISCECTOMY 1 LEVEL
Anesthesia: General | Site: Spine Lumbar

## 2022-04-15 MED ORDER — SUCCINYLCHOLINE CHLORIDE 200 MG/10ML IV SOSY
PREFILLED_SYRINGE | INTRAVENOUS | Status: DC | PRN
  Administered 2022-04-15: 100 mg via INTRAVENOUS

## 2022-04-15 MED ORDER — PHENYLEPHRINE 80 MCG/ML (10ML) SYRINGE FOR IV PUSH (FOR BLOOD PRESSURE SUPPORT)
PREFILLED_SYRINGE | INTRAVENOUS | Status: AC
  Filled 2022-04-15: qty 10

## 2022-04-15 MED ORDER — PANTOPRAZOLE SODIUM 40 MG PO TBEC
40.0000 mg | DELAYED_RELEASE_TABLET | Freq: Every day | ORAL | Status: DC
  Administered 2022-04-15 – 2022-04-16 (×2): 40 mg via ORAL
  Filled 2022-04-15 (×2): qty 1

## 2022-04-15 MED ORDER — FUROSEMIDE 20 MG PO TABS: 20.0000 mg | ORAL_TABLET | ORAL | Status: DC | PRN

## 2022-04-15 MED ORDER — PHENYLEPHRINE HCL (PRESSORS) 10 MG/ML IV SOLN
INTRAVENOUS | Status: DC | PRN
  Administered 2022-04-15: 80 ug via INTRAVENOUS
  Administered 2022-04-15: 160 ug via INTRAVENOUS
  Administered 2022-04-15 (×2): 80 ug via INTRAVENOUS

## 2022-04-15 MED ORDER — ONDANSETRON HCL 4 MG/2ML IJ SOLN
INTRAMUSCULAR | Status: DC | PRN
  Administered 2022-04-15: 4 mg via INTRAVENOUS

## 2022-04-15 MED ORDER — BUPIVACAINE-EPINEPHRINE (PF) 0.5% -1:200000 IJ SOLN
INTRAMUSCULAR | Status: AC
  Filled 2022-04-15: qty 30

## 2022-04-15 MED ORDER — LIDOCAINE HCL (PF) 2 % IJ SOLN
INTRAMUSCULAR | Status: AC
  Filled 2022-04-15: qty 5

## 2022-04-15 MED ORDER — ETODOLAC 400 MG PO TABS
400.0000 mg | ORAL_TABLET | Freq: Every morning | ORAL | Status: DC
  Filled 2022-04-15: qty 1

## 2022-04-15 MED ORDER — ACETAMINOPHEN 650 MG RE SUPP: 650.0000 mg | RECTAL | Status: DC | PRN

## 2022-04-15 MED ORDER — ETODOLAC 200 MG PO CAPS
400.0000 mg | ORAL_CAPSULE | Freq: Every day | ORAL | Status: DC
  Administered 2022-04-15 – 2022-04-16 (×2): 400 mg via ORAL
  Filled 2022-04-15 (×2): qty 2

## 2022-04-15 MED ORDER — METHOCARBAMOL 500 MG PO TABS
500.0000 mg | ORAL_TABLET | Freq: Four times a day (QID) | ORAL | Status: DC | PRN
  Administered 2022-04-15: 500 mg via ORAL
  Filled 2022-04-15: qty 1

## 2022-04-15 MED ORDER — CHLORHEXIDINE GLUCONATE 0.12 % MT SOLN
OROMUCOSAL | Status: AC
  Administered 2022-04-15: 15 mL via OROMUCOSAL
  Filled 2022-04-15: qty 15

## 2022-04-15 MED ORDER — SUCCINYLCHOLINE CHLORIDE 200 MG/10ML IV SOSY
PREFILLED_SYRINGE | INTRAVENOUS | Status: AC
  Filled 2022-04-15: qty 10

## 2022-04-15 MED ORDER — SODIUM CHLORIDE 0.9% FLUSH
3.0000 mL | Freq: Two times a day (BID) | INTRAVENOUS | Status: DC
  Administered 2022-04-15 – 2022-04-16 (×2): 3 mL via INTRAVENOUS

## 2022-04-15 MED ORDER — SENNA 8.6 MG PO TABS
1.0000 | ORAL_TABLET | Freq: Two times a day (BID) | ORAL | Status: DC
  Administered 2022-04-15 – 2022-04-16 (×3): 8.6 mg via ORAL
  Filled 2022-04-15 (×3): qty 1

## 2022-04-15 MED ORDER — METHYLPREDNISOLONE ACETATE 40 MG/ML IJ SUSP
INTRAMUSCULAR | Status: AC
  Filled 2022-04-15: qty 1

## 2022-04-15 MED ORDER — ROSUVASTATIN CALCIUM 5 MG PO TABS
5.0000 mg | ORAL_TABLET | Freq: Every day | ORAL | Status: DC
  Administered 2022-04-15 – 2022-04-16 (×2): 5 mg via ORAL
  Filled 2022-04-15 (×2): qty 1

## 2022-04-15 MED ORDER — GABAPENTIN 100 MG PO CAPS
100.0000 mg | ORAL_CAPSULE | Freq: Every day | ORAL | Status: DC
  Administered 2022-04-15: 100 mg via ORAL
  Filled 2022-04-15: qty 1

## 2022-04-15 MED ORDER — LIDOCAINE HCL (CARDIAC) PF 100 MG/5ML IV SOSY
PREFILLED_SYRINGE | INTRAVENOUS | Status: DC | PRN
  Administered 2022-04-15: 100 mg via INTRAVENOUS

## 2022-04-15 MED ORDER — PHENYLEPHRINE HCL-NACL 20-0.9 MG/250ML-% IV SOLN
INTRAVENOUS | Status: AC
  Filled 2022-04-15: qty 250

## 2022-04-15 MED ORDER — ACETAMINOPHEN 325 MG PO TABS: 650.0000 mg | ORAL_TABLET | ORAL | Status: DC | PRN

## 2022-04-15 MED ORDER — DEXAMETHASONE SODIUM PHOSPHATE 10 MG/ML IJ SOLN
INTRAMUSCULAR | Status: AC
  Filled 2022-04-15: qty 1

## 2022-04-15 MED ORDER — ONDANSETRON HCL 4 MG/2ML IJ SOLN
INTRAMUSCULAR | Status: AC
  Filled 2022-04-15: qty 2

## 2022-04-15 MED ORDER — PROPOFOL 10 MG/ML IV BOLUS
INTRAVENOUS | Status: AC
  Filled 2022-04-15: qty 20

## 2022-04-15 MED ORDER — AMITRIPTYLINE HCL 25 MG PO TABS
50.0000 mg | ORAL_TABLET | Freq: Every day | ORAL | Status: DC
  Administered 2022-04-15: 50 mg via ORAL
  Filled 2022-04-15: qty 2

## 2022-04-15 MED ORDER — ONDANSETRON HCL 4 MG/2ML IJ SOLN: 4.0000 mg | Freq: Once | INTRAMUSCULAR | Status: DC | PRN

## 2022-04-15 MED ORDER — PROPOFOL 10 MG/ML IV BOLUS
INTRAVENOUS | Status: DC | PRN
  Administered 2022-04-15: 80 mg via INTRAVENOUS

## 2022-04-15 MED ORDER — SODIUM CHLORIDE 0.9 % IV SOLN: 250.0000 mL | INTRAVENOUS | Status: DC

## 2022-04-15 MED ORDER — CEFAZOLIN IN SODIUM CHLORIDE 2-0.9 GM/100ML-% IV SOLN
2.0000 g | Freq: Once | INTRAVENOUS | Status: AC
  Administered 2022-04-15: 2 g via INTRAVENOUS
  Filled 2022-04-15: qty 100

## 2022-04-15 MED ORDER — PHENOL 1.4 % MT LIQD: 1.0000 | OROMUCOSAL | Status: DC | PRN

## 2022-04-15 MED ORDER — ONDANSETRON HCL 4 MG PO TABS: 4.0000 mg | ORAL_TABLET | Freq: Four times a day (QID) | ORAL | Status: DC | PRN

## 2022-04-15 MED ORDER — SODIUM CHLORIDE 0.9% FLUSH: 3.0000 mL | INTRAVENOUS | Status: DC | PRN

## 2022-04-15 MED ORDER — SODIUM CHLORIDE 0.9 % IV SOLN: INTRAVENOUS | Status: DC

## 2022-04-15 MED ORDER — DEXAMETHASONE SODIUM PHOSPHATE 10 MG/ML IJ SOLN
INTRAMUSCULAR | Status: DC | PRN
  Administered 2022-04-15: 10 mg via INTRAVENOUS

## 2022-04-15 MED ORDER — OXYCODONE HCL 5 MG/5ML PO SOLN: 5.0000 mg | Freq: Once | ORAL | Status: DC | PRN

## 2022-04-15 MED ORDER — LACTATED RINGERS IV SOLN: INTRAVENOUS | Status: DC

## 2022-04-15 MED ORDER — ACETAMINOPHEN 10 MG/ML IV SOLN: 1000.0000 mg | Freq: Once | INTRAVENOUS | Status: DC | PRN

## 2022-04-15 MED ORDER — ACETAMINOPHEN 10 MG/ML IV SOLN
INTRAVENOUS | Status: DC | PRN
  Administered 2022-04-15: 1000 mg via INTRAVENOUS

## 2022-04-15 MED ORDER — CHLORHEXIDINE GLUCONATE 0.12 % MT SOLN: 15.0000 mL | Freq: Once | OROMUCOSAL | Status: AC

## 2022-04-15 MED ORDER — AMLODIPINE BESYLATE 5 MG PO TABS
5.0000 mg | ORAL_TABLET | Freq: Every day | ORAL | Status: DC
  Administered 2022-04-15: 5 mg via ORAL
  Filled 2022-04-15: qty 1

## 2022-04-15 MED ORDER — METOPROLOL SUCCINATE ER 25 MG PO TB24
25.0000 mg | ORAL_TABLET | Freq: Every day | ORAL | Status: DC
  Administered 2022-04-15: 25 mg via ORAL
  Filled 2022-04-15: qty 1

## 2022-04-15 MED ORDER — FIBRIN SEALANT 2 ML SINGLE DOSE KIT
PACK | CUTANEOUS | Status: DC | PRN
  Administered 2022-04-15: 2 mL via TOPICAL

## 2022-04-15 MED ORDER — CEFAZOLIN SODIUM-DEXTROSE 2-4 GM/100ML-% IV SOLN
INTRAVENOUS | Status: AC
  Filled 2022-04-15: qty 100

## 2022-04-15 MED ORDER — ONDANSETRON HCL 4 MG/2ML IJ SOLN: 4.0000 mg | Freq: Four times a day (QID) | INTRAMUSCULAR | Status: DC | PRN

## 2022-04-15 MED ORDER — FENTANYL CITRATE (PF) 100 MCG/2ML IJ SOLN
INTRAMUSCULAR | Status: AC
  Filled 2022-04-15: qty 2

## 2022-04-15 MED ORDER — PHENYLEPHRINE HCL-NACL 20-0.9 MG/250ML-% IV SOLN
INTRAVENOUS | Status: DC | PRN
  Administered 2022-04-15: 40 ug/min via INTRAVENOUS

## 2022-04-15 MED ORDER — FLUTICASONE PROPIONATE 50 MCG/ACT NA SUSP
2.0000 | Freq: Every day | NASAL | Status: DC
  Administered 2022-04-16: 2 via NASAL
  Filled 2022-04-15: qty 16

## 2022-04-15 MED ORDER — MENTHOL 3 MG MT LOZG: 1.0000 | LOZENGE | OROMUCOSAL | Status: DC | PRN

## 2022-04-15 MED ORDER — METHYLPREDNISOLONE ACETATE 40 MG/ML IJ SUSP: INTRAMUSCULAR | Status: DC | PRN

## 2022-04-15 MED ORDER — ENOXAPARIN SODIUM 40 MG/0.4ML IJ SOSY
40.0000 mg | PREFILLED_SYRINGE | INTRAMUSCULAR | Status: DC
  Administered 2022-04-16: 40 mg via SUBCUTANEOUS
  Filled 2022-04-15: qty 0.4

## 2022-04-15 MED ORDER — FENTANYL CITRATE (PF) 100 MCG/2ML IJ SOLN
INTRAMUSCULAR | Status: DC | PRN
  Administered 2022-04-15 (×3): 50 ug via INTRAVENOUS

## 2022-04-15 MED ORDER — CLONAZEPAM 1 MG PO TABS
1.0000 mg | ORAL_TABLET | Freq: Every day | ORAL | Status: DC | PRN
  Administered 2022-04-15: 1 mg via ORAL
  Filled 2022-04-15: qty 1

## 2022-04-15 MED ORDER — 0.9 % SODIUM CHLORIDE (POUR BTL) OPTIME
TOPICAL | Status: DC | PRN
  Administered 2022-04-15: 500 mL

## 2022-04-15 MED ORDER — OXYCODONE HCL 5 MG PO TABS: 5.0000 mg | ORAL_TABLET | Freq: Once | ORAL | Status: DC | PRN

## 2022-04-15 MED ORDER — SODIUM CHLORIDE (PF) 0.9 % IJ SOLN
INTRAMUSCULAR | Status: DC | PRN
  Administered 2022-04-15: 60 mL via INTRAMUSCULAR

## 2022-04-15 MED ORDER — BUPIVACAINE HCL (PF) 0.5 % IJ SOLN
INTRAMUSCULAR | Status: AC
  Filled 2022-04-15: qty 30

## 2022-04-15 MED ORDER — ORAL CARE MOUTH RINSE: 15.0000 mL | Freq: Once | OROMUCOSAL | Status: AC

## 2022-04-15 MED ORDER — OXYCODONE HCL 5 MG PO TABS
10.0000 mg | ORAL_TABLET | ORAL | Status: DC | PRN
  Administered 2022-04-15 – 2022-04-16 (×2): 10 mg via ORAL
  Filled 2022-04-15 (×2): qty 2

## 2022-04-15 MED ORDER — BUPIVACAINE-EPINEPHRINE (PF) 0.5% -1:200000 IJ SOLN
INTRAMUSCULAR | Status: DC | PRN
  Administered 2022-04-15: 9 mL via PERINEURAL

## 2022-04-15 MED ORDER — BUPIVACAINE LIPOSOME 1.3 % IJ SUSP
INTRAMUSCULAR | Status: AC
  Filled 2022-04-15: qty 20

## 2022-04-15 MED ORDER — SODIUM CHLORIDE FLUSH 0.9 % IV SOLN
INTRAVENOUS | Status: AC
  Filled 2022-04-15: qty 20

## 2022-04-15 MED ORDER — SURGIFLO WITH THROMBIN (HEMOSTATIC MATRIX KIT) OPTIME
TOPICAL | Status: DC | PRN
  Administered 2022-04-15: 1 via TOPICAL

## 2022-04-15 MED ORDER — METHOCARBAMOL 1000 MG/10ML IJ SOLN: 500.0000 mg | Freq: Four times a day (QID) | INTRAVENOUS | Status: DC | PRN

## 2022-04-15 MED ORDER — OXYCODONE HCL 5 MG PO TABS
5.0000 mg | ORAL_TABLET | ORAL | Status: DC | PRN
  Administered 2022-04-15 (×2): 5 mg via ORAL
  Filled 2022-04-15 (×2): qty 1

## 2022-04-15 MED ORDER — ALBUTEROL SULFATE HFA 108 (90 BASE) MCG/ACT IN AERS
INHALATION_SPRAY | RESPIRATORY_TRACT | Status: AC
  Filled 2022-04-15: qty 6.7

## 2022-04-15 MED ORDER — FENTANYL CITRATE (PF) 100 MCG/2ML IJ SOLN: 25.0000 ug | INTRAMUSCULAR | Status: DC | PRN

## 2022-04-15 MED ORDER — ALBUTEROL SULFATE HFA 108 (90 BASE) MCG/ACT IN AERS
INHALATION_SPRAY | RESPIRATORY_TRACT | Status: DC | PRN
  Administered 2022-04-15: 2 via RESPIRATORY_TRACT

## 2022-04-15 MED ORDER — ACETAMINOPHEN 10 MG/ML IV SOLN
INTRAVENOUS | Status: AC
  Filled 2022-04-15: qty 100

## 2022-04-15 SURGICAL SUPPLY — 51 items
ADH SKN CLS APL DERMABOND .7 (GAUZE/BANDAGES/DRESSINGS) ×1
AGENT HMST KT MTR STRL THRMB (HEMOSTASIS) ×1
APL PRP STRL LF DISP 70% ISPRP (MISCELLANEOUS)
BASIN KIT SINGLE STR (MISCELLANEOUS) ×1 IMPLANT
BUR NEURO DRILL SOFT 3.0X3.8M (BURR) ×1 IMPLANT
CHLORAPREP W/TINT 26 (MISCELLANEOUS) ×1 IMPLANT
CNTNR SPEC 2.5X3XGRAD LEK (MISCELLANEOUS)
CONT SPEC 4OZ STER OR WHT (MISCELLANEOUS)
CONT SPEC 4OZ STRL OR WHT (MISCELLANEOUS)
CONTAINER SPEC 2.5X3XGRAD LEK (MISCELLANEOUS) ×1 IMPLANT
DERMABOND ADVANCED .7 DNX12 (GAUZE/BANDAGES/DRESSINGS) ×1 IMPLANT
DRAPE C ARM PK CFD 31 SPINE (DRAPES) ×1 IMPLANT
DRAPE LAPAROTOMY 100X77 ABD (DRAPES) ×1 IMPLANT
DRAPE MICROSCOPE SPINE 48X150 (DRAPES) ×1 IMPLANT
DRAPE SURG 17X11 SM STRL (DRAPES) ×1 IMPLANT
DRSG OPSITE POSTOP 3X4 (GAUZE/BANDAGES/DRESSINGS) IMPLANT
ELECT EZSTD 165MM 6.5IN (MISCELLANEOUS)
ELECT REM PT RETURN 9FT ADLT (ELECTROSURGICAL) ×1
ELECTRODE EZSTD 165MM 6.5IN (MISCELLANEOUS) IMPLANT
ELECTRODE REM PT RTRN 9FT ADLT (ELECTROSURGICAL) ×1 IMPLANT
GLOVE BIOGEL PI IND STRL 6.5 (GLOVE) ×1 IMPLANT
GLOVE BIOGEL PI IND STRL 8.5 (GLOVE) ×1 IMPLANT
GLOVE SURG SYN 6.5 ES PF (GLOVE) ×2 IMPLANT
GLOVE SURG SYN 6.5 PF PI (GLOVE) ×2 IMPLANT
GLOVE SURG SYN 8.5  E (GLOVE) ×3
GLOVE SURG SYN 8.5 E (GLOVE) ×3 IMPLANT
GLOVE SURG SYN 8.5 PF PI (GLOVE) ×3 IMPLANT
GOWN SRG LRG LVL 4 IMPRV REINF (GOWNS) ×1 IMPLANT
GOWN SRG XL LVL 3 NONREINFORCE (GOWNS) ×1 IMPLANT
GOWN STRL NON-REIN TWL XL LVL3 (GOWNS) ×1
GOWN STRL REIN LRG LVL4 (GOWNS) ×1
GRAFT DURAGEN MATRIX 1WX1L (Tissue) IMPLANT
KIT SPINAL PRONEVIEW (KITS) ×1 IMPLANT
MANIFOLD NEPTUNE II (INSTRUMENTS) ×1 IMPLANT
MARKER SKIN DUAL TIP RULER LAB (MISCELLANEOUS) ×1 IMPLANT
NDL SAFETY ECLIP 18X1.5 (MISCELLANEOUS) ×1 IMPLANT
NS IRRIG 1000ML POUR BTL (IV SOLUTION) ×1 IMPLANT
NS IRRIG 500ML POUR BTL (IV SOLUTION) IMPLANT
PACK LAMINECTOMY NEURO (CUSTOM PROCEDURE TRAY) ×1 IMPLANT
PAD ARMBOARD 7.5X6 YLW CONV (MISCELLANEOUS) ×1 IMPLANT
SURGIFLO W/THROMBIN 8M KIT (HEMOSTASIS) ×1 IMPLANT
SUT DVC VLOC 3-0 CL 6 P-12 (SUTURE) ×1 IMPLANT
SUT ETHILON 3-0 (SUTURE) IMPLANT
SUT VIC AB 0 CT1 27 (SUTURE) ×1
SUT VIC AB 0 CT1 27XCR 8 STRN (SUTURE) ×1 IMPLANT
SUT VIC AB 2-0 CT1 18 (SUTURE) ×1 IMPLANT
SYR 10ML LL (SYRINGE) ×2 IMPLANT
SYR 30ML LL (SYRINGE) ×2 IMPLANT
SYR 3ML LL SCALE MARK (SYRINGE) ×1 IMPLANT
TRAP FLUID SMOKE EVACUATOR (MISCELLANEOUS) ×1 IMPLANT
WATER STERILE IRR 1000ML POUR (IV SOLUTION) ×2 IMPLANT

## 2022-04-15 NOTE — H&P (Signed)
Referring Physician:  No referring provider defined for this encounter.  Primary Physician:  Rusty Aus, MD  History of Present Illness: 04/15/2022 [name] is here today with a chief complaint of the symptoms noted below.  Her draining wound has healed for the most part.  02/27/2022 [name] is here today with a chief complaint of  bilateral low back pain with radiation to the left lateral thigh and calf into her ankle with some weakness in the left leg.    She has had multiple falls recently.  She has been having pain for 3 months.  All activities make it worse.  10 out of 10 pain down her left leg in an L5 distribution.     Bowel/Bladder Dysfunction: none   Conservative measures:  Physical therapy: has not participated in Multimodal medical therapy including regular antiinflammatories: advil, meloxicam, oxycodone  Injections: has not received any epidural steroid injections   Past Surgery: denies    Kristen Potts has no symptoms of cervical myelopathy.   The symptoms are causing a significant impact on the patient's life.   Review of Systems:  A 10 point review of systems is negative, except for the pertinent positives and negatives detailed in the HPI.  Past Medical History: Past Medical History:  Diagnosis Date   Anxiety    Arthritis    knees   COPD (chronic obstructive pulmonary disease) ([place])    Depression    GERD (gastroesophageal reflux disease)    Glaucoma    Hyperlipidemia    Hypertension    Vocal cord polyp    Wears dentures    full upper and lower    Past Surgical History: Past Surgical History:  Procedure Laterality Date   CATARACT EXTRACTION     CATARACT EXTRACTION W/PHACO Left 03/28/2019   Procedure: CATARACT EXTRACTION PHACO AND INTRAOCULAR LENS PLACEMENT (IOC) LEFT  01:03.9  15.0%  9.59;  Surgeon: Birder Robson, MD;  Location: [facility];  Service: Ophthalmology;  Laterality: Left;   COLONOSCOPY   11/08/2006   COLONOSCOPY W/ POLYPECTOMY  04/06/2014   adenomatous polyps, family history of colon polyps   COLONOSCOPY WITH PROPOFOL N/A 11/10/2017   Procedure: COLONOSCOPY WITH PROPOFOL;  Surgeon: Manya Silvas, MD;  Location[facility];  Service: Endoscopy;  Laterality: N/A;   TONSILLECTOMY     and adenoidectomy   vocal cord polypectomy  2012    Allergies: Allergies as of 03/25/2022 - Review Complete 03/25/2022  Allergen Reaction Noted   Codeine Nausea Only 03/19/2014   Mirtazapine Other (See Comments) 02/18/2016   Sulfa antibiotics Other (See Comments) 03/19/2014   Tramadol Other (See Comments) 03/19/2014   Benzodiazepines Anxiety 08/16/2017    Medications: Current Meds  Medication Sig   amitriptyline (ELAVIL) 25 MG tablet Take 50 mg by mouth at bedtime.   amLODipine (NORVASC) 5 MG tablet Take 5 mg by mouth at bedtime.   Bioflavonoid Products (BIOFLEX PO) Take 1 tablet by mouth daily.   Calcium Carbonate-Vitamin D 600-200 MG-UNIT TABS Take 1 tablet by mouth daily.   Cholecalciferol (VITAMIN D3) 50 MCG (2000 UT) TABS Take 2,000 Units by mouth daily.   clonazePAM (KLONOPIN) 1 MG tablet Take 1 mg by mouth daily as needed for anxiety.   etodolac (LODINE) 400 MG tablet Take 400 mg by mouth every morning.   furosemide (LASIX) 20 MG tablet Take 20 mg by mouth as needed.   gabapentin (NEURONTIN) 100 MG capsule Take 100 mg by mouth at bedtime.  metoprolol succinate (TOPROL-XL) 25 MG 24 hr tablet Take 25 mg by mouth daily.   oxycodone (OXY-IR) 5 MG capsule Take 5 mg by mouth 2 (two) times daily as needed for pain.   rosuvastatin (CRESTOR) 5 MG tablet Take 5 mg by mouth at bedtime.   vitamin B-12 (CYANOCOBALAMIN) 500 MCG tablet Take 500 mcg by mouth daily.    Social History: Social History   Tobacco Use   Smoking status: Every Day    Packs/day: 1.00    Years: 50.00    Total pack years: 50.00    Types: Cigarettes   Smokeless tobacco: Never  Vaping Use   Vaping Use:  Never used  Substance Use Topics   Alcohol use: Never   Drug use: Never    Family Medical History: Family History  Problem Relation Age of Onset   Alcohol abuse Brother    Drug abuse Brother    Breast cancer Neg Hx     Physical Examination: Vitals:   04/15/22 0619  BP: (!) 138/99  Pulse: (!) 109  Resp: 16  Temp: (!) 97.4 F (36.3 C)  SpO2: 96%   Heart sounds normal no MRG. Chest Clear to Auscultation Bilaterally.   General: Patient is well developed, well nourished, calm, collected, and in no apparent distress. Attention to examination is appropriate.  Neck:   Supple.  Full range of motion.  Respiratory: Patient is breathing without any difficulty.   NEUROLOGICAL:     Awake, alert, oriented to person, place, and time.  Speech is clear and fluent. Fund of knowledge is appropriate.   Cranial Nerves: Pupils equal round and reactive to light.  Facial tone is symmetric.  Facial sensation is symmetric. Shoulder shrug is symmetric. Tongue protrusion is midline.  There is no pronator drift.  ROM of spine: full.    Strength: Side Biceps Triceps Deltoid Interossei Grip Wrist Ext. Wrist Flex.  R '5 5 5 5 5 5 5  '$ L '5 5 5 5 5 5 5   '$ Side Iliopsoas Quads Hamstring PF DF EHL  R '5 5 5 5 5 5  '$ L 5 5 4- 4- 5 4-   Reflexes are 1+ and symmetric at the biceps, triceps, brachioradialis, patella and achilles.   Hoffman's is absent.   Bilateral upper and lower extremity sensation is intact to light touch except L L5 distribution which is diminished.    No evidence of dysmetria noted.     Medical Decision Making  Imaging: MRI L spine 02/20/22 IMPRESSION: 1.  No acute osseous injury of the lumbar spine. 2. At L4-5 there is a broad-based disc bulge. Severe right facet arthropathy with a inspissated 10 mm right facet intraspinal synovial cyst with mass effect on the intraspinal nerve roots. Mild left facet arthropathy. Severe spinal stenosis. Moderate right foraminal stenosis. 3.  At L3-4 there is a broad-based disc bulge. Mild bilateral facet arthropathy. Bilateral lateral recess stenosis. Moderate left foraminal stenosis. 4. At L2-3 there is a broad-based disc bulge. Mild spinal stenosis.     Electronically Signed   By: Kathreen Devoid M.D.   On: 02/20/2022 21:25  I have personally reviewed the images and agree with the above interpretation.  Assessment and Plan: Kristen Potts is a pleasant 76 y.o. female with severe left leg pain due to lumbar radiculopathy from neurogenic claudication and severe stenosis at L4-5.  She has weakness in her left leg and has had multiple falls.     At this point, I think she is  at too high risk from her continued falls to move forward with conservative management.  She also has weakness in her left leg.  Thus, I recommended intervention with an L4-5 decompression.   Thank you for involving me in the care of this patient.      Yasheka Fossett K. Izora Ribas MD, [facility]

## 2022-04-15 NOTE — Anesthesia Procedure Notes (Signed)
Procedure Name: Intubation Date/Time: 04/15/2022 7:25 AM  Performed by: Cammie Sickle, CRNAPre-anesthesia Checklist: Patient identified, Patient being monitored, Timeout performed, Emergency Drugs available and Suction available Patient Re-evaluated:Patient Re-evaluated prior to induction Oxygen Delivery Method: Circle system utilized Preoxygenation: Pre-oxygenation with 100% oxygen Induction Type: IV induction Ventilation: Mask ventilation without difficulty Laryngoscope Size: 3 and McGraph Grade View: Grade I Tube type: Oral Tube size: 6.5 mm Number of attempts: 1 Airway Equipment and Method: Stylet Placement Confirmation: ETT inserted through vocal cords under direct vision, positive ETCO2 and breath sounds checked- equal and bilateral Secured at: 21 cm Tube secured with: Tape Dental Injury: Teeth and Oropharynx as per pre-operative assessment

## 2022-04-15 NOTE — Anesthesia Preprocedure Evaluation (Signed)
Anesthesia Evaluation  Patient identified by MRN, date of birth, ID band Patient awake    Reviewed: Allergy & Precautions, NPO status , Patient's Chart, lab work & pertinent test results  History of Anesthesia Complications Negative for: history of anesthetic complications  Airway Mallampati: III  TM Distance: <3 FB Neck ROM: Limited    Dental  (+) Upper Dentures, Lower Dentures   Pulmonary neg sleep apnea, neg COPD, Patient abstained from smoking.Not current smoker, former smoker,  Patient denies COPD although it is listed on her chart   breath sounds clear to auscultation       Cardiovascular Exercise Tolerance: Good METShypertension, Pt. on medications (-) angina(-) CAD, (-) Past MI and (-) DOE (-) dysrhythmias  Rhythm:Regular Rate:Normal   HLD   Neuro/Psych PSYCHIATRIC DISORDERS Anxiety Depression negative neurological ROS     GI/Hepatic GERD  Controlled and Medicated,(+)     (-) substance abuse  ,   Endo/Other  neg diabetes  Renal/GU negative Renal ROS     Musculoskeletal  (+) Arthritis ,   Abdominal   Peds  Hematology   Anesthesia Other Findings Past Medical History: No date: Anxiety No date: Arthritis     Comment:  knees No date: COPD (chronic obstructive pulmonary disease) ([facility]) No date: Depression No date: GERD (gastroesophageal reflux disease) No date: Glaucoma No date: Hyperlipidemia No date: Hypertension No date: Vocal cord polyp No date: Wears dentures     Comment:  full upper and lower  Reproductive/Obstetrics                             Anesthesia Physical  Anesthesia Plan  ASA: 2  Anesthesia Plan: General   Post-op Pain Management: Ofirmev IV (intra-op)*   Induction: Intravenous  PONV Risk Score and Plan: 4 or greater and Ondansetron, Dexamethasone and Treatment may vary due to age or medical condition  Airway Management Planned: Oral  ETT  Additional Equipment: None  Intra-op Plan:   Post-operative Plan: Extubation in OR  Informed Consent: I have reviewed the patients History and Physical, chart, labs and discussed the procedure including the risks, benefits and alternatives for the proposed anesthesia with the patient or authorized representative who has indicated his/her understanding and acceptance.     Dental advisory given  Plan Discussed with: CRNA and Surgeon  Anesthesia Plan Comments: (Discussed risks of anesthesia with patient, including PONV, sore throat, lip/dental/eye damage. Rare risks discussed as well, such as cardiorespiratory and neurological sequelae, and allergic reactions. Discussed the role of CRNA in patient's perioperative care. Patient understands.)        Anesthesia Quick Evaluation

## 2022-04-15 NOTE — Op Note (Signed)
Indications: [name] is suffering from lumbar stenosis causing neurogenic claudication (ICD10 M48.062). The patient tried and failed conservative management, prompting surgical intervention.  Findings: synovial cyst  Preoperative Diagnosis: M54.16-Lumbar radiculopathy, M71.38- Synovial cyst of lumbar facet joint , R29.898-Deficiencies of limbs  Postoperative Diagnosis: same   EBL: 25 ml IVF: see AR ml Drains: none Disposition: Extubated and Stable to PACU Complications: none  No foley catheter was placed.   Preoperative Note:   Risks of surgery discussed include: infection, bleeding, stroke, coma, death, paralysis, CSF leak, nerve/spinal cord injury, numbness, tingling, weakness, complex regional pain syndrome, recurrent stenosis and/or disc herniation, vascular injury, development of instability, neck/back pain, need for further surgery, persistent symptoms, development of deformity, and the risks of anesthesia. The patient understood these risks and agreed to proceed.  Operative Note:   1. L4-5 lumbar decompression including central laminectomy and bilateral medial facetectomies including foraminotomies  The patient was then brought from the preoperative center with intravenous access established.  The patient underwent general anesthesia and endotracheal tube intubation, and was then rotated on the [place] rail top where all pressure points were appropriately padded.  The skin was then thoroughly cleansed.  Perioperative antibiotic prophylaxis was administered.  Sterile prep and drapes were then applied and a timeout was then observed.  C-arm was brought into the field under sterile conditions and under lateral visualization the L4-5 interspace was identified and marked.  The incision was marked on the left and injected with local anesthetic. Once this was complete a 2 cm incision was opened with the use of a #10 blade knife.    The metrx tubes were sequentially advanced and  confirmed in position at L4-5. An 72m by 65mtube was locked in place to the bed side attachment.  The microscope was then sterilely brought into the field and muscle creep was hemostased with a bipolar and resected with a pituitary rongeur.  A Bovie extender was then used to expose the spinous process and lamina.  Careful attention was placed to not violate the facet capsule. A 3 mm matchstick drill bit was then used to make a hemi-laminotomy trough until the ligamentum flavum was exposed.  This was extended to the base of the spinous process and to the contralateral side to remove all the central bone from each side.  Once this was complete and the underlying ligamentum flavum was visualized, it was dissected with a curette and resected with Kerrison rongeurs.  Extensive ligamentum hypertrophy was noted, requiring a substantial amount of time and care for removal.  The dura was identified and palpated. The kerrison rongeur was then used to remove the medial facet bilaterally until no compression was noted.  A balltip probe was used to confirm decompression of the ipsilateral L5 nerve root.  Additional attention was paid to completion of the contralateral L4-5 foraminotomy until the contralateral traversing nerve root was completely free.  Once this was complete, L4-5 central decompression including medial facetectomy and foraminotomy was confirmed and decompression on both sides was confirmed.   An area where the synovial cyst was removed was adhered to the dura, causing a small durotomy during the normal course of removing the synovial cyst.  A small CSF leak was noted, subsequently controlled with duragen and tisseel.  The wound was copiously irrigated. The tube system was then removed under microscopic visualization and hemostasis was obtained with a bipolar.    The fascial layer was reapproximated with the use of a 0 Vicryl suture.  Subcutaneous  tissue layer was reapproximated using 2-0 Vicryl  suture.  3-0 nylon was used on the skin.  Patient was then rotated back to the preoperative bed awakened from anesthesia and taken to recovery all counts are correct in this case.  I performed the entire procedure with the assistance of Geronimo Boot PA as an Pensions consultant. An assistant was required for this procedure due to the complexity.  The assistant provided assistance in tissue manipulation and suction, and was required for the successful and safe performance of the procedure. I performed the critical portions of the procedure.   Zakyia Gagan K. Izora Ribas MD

## 2022-04-15 NOTE — Transfer of Care (Signed)
Immediate Anesthesia Transfer of Care Note  Patient: Kristen Potts  Procedure(s) Performed: L4-5 DECOMPRESSION (Spine Lumbar)  Patient Location: PACU  Anesthesia Type:General  Level of Consciousness: Drowsy  Airway & Oxygen Therapy: Patient Spontanous Breathing and Patient connected to face mask oxygen  Post-op Assessment: Report given to RN and Post -op Vital signs reviewed and stable  Post vital signs: Reviewed and stable  Last Vitals:  Vitals Value Taken Time  BP 147/83 04/15/22 0909  Temp 35.9 0908  Pulse 94 04/15/22 0912  Resp 22 04/15/22 0914  SpO2 100 % 04/15/22 0912  Vitals shown include unvalidated device data.  Last Pain:  Vitals:   04/15/22 0619  TempSrc: Temporal  PainSc: 0-No pain         Complications: No notable events documented.

## 2022-04-15 NOTE — Anesthesia Postprocedure Evaluation (Signed)
Anesthesia Post Note  Patient: Kristen Potts  Procedure(s) Performed: L4-5 DECOMPRESSION (Spine Lumbar)  Patient location during evaluation: PACU Anesthesia Type: General Level of consciousness: awake and alert Pain management: pain level controlled Vital Signs Assessment: post-procedure vital signs reviewed and stable Respiratory status: spontaneous breathing, nonlabored ventilation, respiratory function stable and patient connected to nasal cannula oxygen Cardiovascular status: blood pressure returned to baseline and stable Postop Assessment: no apparent nausea or vomiting Anesthetic complications: no   No notable events documented.   Last Vitals:  Vitals:   04/15/22 0945 04/15/22 1000  BP: (!) 149/91 (!) 154/87  Pulse: 92 91  Resp: 12 11  Temp:  (!) 36.1 C  SpO2: 97% 96%    Last Pain:  Vitals:   04/15/22 1000  TempSrc:   PainSc: 0-No pain                 Arita Miss

## 2022-04-16 ENCOUNTER — Other Ambulatory Visit: Payer: Self-pay | Admitting: Neurosurgery

## 2022-04-16 ENCOUNTER — Telehealth: Payer: Self-pay

## 2022-04-16 ENCOUNTER — Encounter: Payer: Self-pay | Admitting: Neurosurgery

## 2022-04-16 DIAGNOSIS — R509 Fever, unspecified: Secondary | ICD-10-CM | POA: Diagnosis not present

## 2022-04-16 DIAGNOSIS — A0472 Enterocolitis due to Clostridium difficile, not specified as recurrent: Secondary | ICD-10-CM | POA: Diagnosis not present

## 2022-04-16 MED ORDER — OXYCODONE HCL 5 MG PO TABS: 5.0000 mg | ORAL_TABLET | ORAL | 0 refills | Status: DC | PRN

## 2022-04-16 MED ORDER — METHOCARBAMOL 500 MG PO TABS: 500.0000 mg | ORAL_TABLET | Freq: Four times a day (QID) | ORAL | 0 refills | Status: DC | PRN

## 2022-04-16 NOTE — Evaluation (Signed)
Occupational Therapy Evaluation Patient Details Name: Kristen Potts MRN: [id number] DOB: [birth year] Today's Date: 04/16/2022   History of Present Illness Pt is a 76yo female s/p L4-5 lumbar decompression including central laminectomy and bilateral medial facetectomies including foraminotomies on 04/15/22, cyst removal. PMhx includes anxiety, arthritis, COPD, depression. GERD, glaucoma, HLD, HTN.   Clinical Impression   Pt seen for OT evaluation this date, POD#1 from surgery above. Prior to hospital admission, pt was mod independent with mobility using RW, basic ADL, and received some assist from sister for IADL (meals, groceries, transportation recently). A few falls in past 26mo [facility] Pt reports her sister will be helping her initially at discharge. Currently pt is requires SBA for ADL transfers with RW +VC for hand placement/RW mgt for safety, supv for ADL mobility with RW, and MIN A For LB ADL Tasks. She trialed use of BSC over toilet and endorses it worked well. Pt educated in back guidelines with handout provided, self care skills, bed mobility and functional transfer training, AE/DME for bathing, dressing, and toileting needs, and home/routines modifications and falls prevention strategies to maximize safety and functional independence while minimizing falls risk and maintaining precautions. Pt verbalized understanding of all education/training provided. Handout provided to support recall and carry over of learned precautions/techniques for bed mobility, functional transfers, and self care skills. TOC notified that [facility] to use beside the bed for frequent overnight toileting needs to maximize safety.      Recommendations for follow up therapy are one component of a multi-disciplinary discharge planning process, led by the attending physician.  Recommendations may be updated  based on patient status, additional functional criteria and insurance authorization.   Follow Up Recommendations  Home health OT    Assistance Recommended at Discharge Intermittent Supervision/Assistance  Patient can return home with the following A little help with walking and/or transfers;A little help with bathing/dressing/bathroom;Assistance with cooking/housework;Assist for transportation;Help with stairs or ramp for entrance    Functional Status Assessment  Patient has had a recent decline in their functional status and demonstrates the ability to make significant improvements in function in a reasonable and predictable amount of time.  Equipment Recommendations  BSC/3in1;Other (comment) (reacher, handheld shower head)    Recommendations for Other Services       Precautions / Restrictions Precautions Precautions: Fall Restrictions Weight Bearing Restrictions: No      Mobility Bed Mobility               General bed mobility comments: NT up in recliner    Transfers Overall transfer level: Needs assistance Equipment used: Rolling walker (2 wheels) Transfers: Sit to/from Stand Sit to Stand: Supervision           General transfer comment: VC for hand placement with noted improvement in effort required      Balance Overall balance assessment: Needs assistance Sitting-balance support: Feet supported Sitting balance-Leahy Scale: Fair     Standing balance support: Bilateral upper extremity supported Standing balance-Leahy Scale: Fair                             ADL either performed or assessed with clinical judgement   ADL  General ADL Comments: Pt requires MIN A for LB ADL tasks and supv-SBA for ADL transfers with RW. Pt reports sister will assist with LB ADL upon discharge     Vision         Perception     Praxis      Pertinent Vitals/Pain Pain Assessment Pain Assessment:  0-10 Pain Score: 5  Pain Location: midline back 5/10 at end of session, 7/10 at start (premedicated) Pain Descriptors / Indicators: Aching Pain Intervention(s): Limited activity within patient's tolerance, Monitored during session, Repositioned, Premedicated before session     Hand Dominance     Extremity/Trunk Assessment Upper Extremity Assessment Upper Extremity Assessment: Generalized weakness   Lower Extremity Assessment Lower Extremity Assessment: Generalized weakness   Cervical / Trunk Assessment Cervical / Trunk Assessment: Back Surgery   Communication Communication Communication: No difficulties   Cognition Arousal/Alertness: Awake/alert Behavior During Therapy: WFL for tasks assessed/performed Overall Cognitive Status: Within Functional Limits for tasks assessed                                 General Comments: grossly WFL, at times slightly slow to process, PRN VC for sequencing for safety     General Comments       Exercises Other Exercises Other Exercises: Pt/son educated in back guidelines, falls prevention, pet care considerations, AE/DME, home/routines modifications, and bed mobility. Pt verbalized understanding. Handout provided   Shoulder Instructions      Home Living Family/patient expects to be discharged to:: Private residence Living Arrangements: Alone Available Help at Discharge: Family;Available PRN/intermittently (sister, son) Type of Home: House Home Access: Ramped entrance     Home Layout: One level     Bathroom Shower/Tub: Teacher, early years/pre: Standard     Home Equipment: Conservation officer, nature (2 wheels);Cane - single point;Shower seat (uses counter for toilet transfers)   Additional Comments: did endorse 3 falls      Prior Functioning/Environment Prior Level of Function : Independent/Modified Independent;Driving;History of Falls (last six months);Needs assist             Mobility Comments: modI with  RW. does not drive ADLs Comments: modI with ADL, sister assists with groceries, driving recently ,and take out meals. Pt doesn't really cook for herself.        OT Problem List: Decreased strength;Impaired balance (sitting and/or standing);Decreased knowledge of use of DME or AE;Decreased knowledge of precautions;Pain      OT Treatment/Interventions: Self-care/ADL training;Therapeutic exercise;Therapeutic activities;DME and/or AE instruction;Patient/family education;Balance training    OT Goals(Current goals can be found in the care plan section) Acute Rehab OT Goals Patient Stated Goal: go home OT Goal Formulation: With patient/family Time For Goal Achievement: 04/30/22 Potential to Achieve Goals: Good ADL Goals Pt Will Perform Lower Body Dressing: with caregiver independent in assisting;sit to/from stand Pt Will Transfer to Toilet: with modified independence;ambulating (LRAD) Pt Will Perform Toileting - Clothing Manipulation and hygiene: with modified independence Additional ADL Goal #1: Pt will verbalize 100% of back guidelines for movement and how to implement with ADL/mobility, PRN VC to implement., 3/3 opportunities.  OT Frequency: Min 2X/week    Co-evaluation              AM-PAC OT "6 Clicks" Daily Activity     Outcome Measure Help from another person eating meals?: None Help from another person taking care of personal grooming?: None Help from another person toileting, which includes using  toliet, bedpan, or urinal?: A Little Help from another person bathing (including washing, rinsing, drying)?: A Little Help from another person to put on and taking off regular upper body clothing?: None Help from another person to put on and taking off regular lower body clothing?: A Little 6 Click Score: 21   End of Session Equipment Utilized During Treatment: Rolling walker (2 wheels)  Activity Tolerance: Patient tolerated treatment well Patient left: in chair;with call  bell/phone within reach  OT Visit Diagnosis: Other abnormalities of gait and mobility (R26.89);Repeated falls (R29.6);Muscle weakness (generalized) (M62.81)                Time: 5910-2890 OT Time Calculation (min): 28 min Charges:  OT General Charges $OT Visit: 1 Visit OT Evaluation $OT Eval Moderate Complexity: 1 Mod OT Treatments $Self Care/Home Management : 8-22 mins  Ardeth Perfect., MPH, MS, OTR/L ascom [phone] 04/16/22, 10:28 AM

## 2022-04-16 NOTE — Discharge Instructions (Signed)
Your surgeon has performed an operation on your lumbar spine (low back) to relieve pressure on one or more nerves. Many times, patients feel better immediately after surgery and can "overdo it." Even if you feel well, it is important that you follow these activity guidelines. If you do not let your back heal properly from the surgery, you can increase the chance of a disc herniation and/or return of your symptoms. The following are instructions to help in your recovery once you have been discharged from the hospital.  * It is ok to take NSAIDs after surgery.  Activity    No bending, lifting, or twisting ("BLT"). Avoid lifting objects heavier than 10 pounds (gallon milk jug).  Where possible, avoid household activities that involve lifting, bending, pushing, or pulling such as laundry, vacuuming, grocery shopping, and childcare. Try to arrange for help from friends and family for these activities while your back heals.  Increase physical activity slowly as tolerated.  Taking short walks is encouraged, but avoid strenuous exercise. Do not jog, run, bicycle, lift weights, or participate in any other exercises unless specifically allowed by your doctor. Avoid prolonged sitting, including car rides.  Talk to your doctor before resuming sexual activity.  You should not drive until cleared by your doctor.  Until released by your doctor, you should not return to work or school.  You should rest at home and let your body heal.   You may shower two days after your surgery.  After showering, lightly dab your incision dry. Do not take a tub bath or go swimming for 3 weeks, or until approved by your doctor at your follow-up appointment.  If you smoke, we strongly recommend that you quit.  Smoking has been proven to interfere with normal healing in your back and will dramatically reduce the success rate of your surgery. Please contact [facility] (800-QUIT-NOW) and use the resources at www.[facility].com for  assistance in stopping smoking.  Surgical Incision   If you have a dressing on your incision, you may remove it three days after your surgery. Keep your incision area clean and dry.  If you have staples or stitches on your incision, you should have a follow up scheduled for removal. If you do not have staples or stitches, you will have steri-strips (small pieces of surgical tape) or Dermabond glue. The steri-strips/glue should begin to peel away within about a week (it is fine if the steri-strips fall off before then). If the strips are still in place one week after your surgery, you may gently remove them.  Diet            You may return to your usual diet. Be sure to stay hydrated.  When to Contact Us  Although your surgery and recovery will likely be uneventful, you may have some residual numbness, aches, and pains in your back and/or legs. This is normal and should improve in the next few weeks.  However, should you experience any of the following, contact us immediately: New numbness or weakness Pain that is progressively getting worse, and is not relieved by your pain medications or rest Bleeding, redness, swelling, pain, or drainage from surgical incision Chills or flu-like symptoms Fever greater than 101.0 F (38.3 C) Problems with bowel or bladder functions Difficulty breathing or shortness of breath Warmth, tenderness, or swelling in your calf  Contact Information During office hours (Monday-Friday 9 am to 5 pm), please call your physician at [phone] and ask for Kendelyn Jean After hours and   weekends, please call [phone] and speak with the neurosurgeon on call For a life-threatening emergency, call 911 

## 2022-04-16 NOTE — Plan of Care (Signed)
Patient discharged per MD orders at this time.All discharge instructions,education & medications reviewed with the patient.Pt expressed understanding and will comply with dc instructions.follow up appointments was also communicated to the patient.no verbal c/o or any ssx of distress.Pt was discharged home with HH/PT/OT services per order.Pt was transported home by son in a privately owned vehicle.

## 2022-04-16 NOTE — Telephone Encounter (Signed)
Received a notification from [facility]: "Pharmacy comment: Alternative Requested:NOT COVERERED BY INSURANCE, AND PATIENT IS ALREADY ON CLONAZEPAM + OXYCODONE. MUSCLE RELAXER IN ADDITION NOT RECOMMENDED."  I spoke with Kristen Potts. She reports that she takes the clonazepam daily. Per discussion with Dr Izora Ribas, I informed her that we will cancel the muscle relaxer and we will not prescribe a replacement muscle relaxer at this time since she is taking the clonazepam every day. I instructed her to call us if she is having a difficult time without a muscle relaxer and we will revisit the options at that time. She verbalized understanding.  I notified [facility] to cancel the rx

## 2022-04-16 NOTE — Progress Notes (Signed)
    Attending Progress Note  History: Nellene Courtois is here for lumbar stenosis.  POD1: Doing well.  Mild pain  Physical Exam: Vitals:   04/16/22 0008 04/16/22 0430  BP: 123/84 133/75  Pulse: 98 96  Resp: 16 18  Temp: 98.3 F (36.8 C) 98.3 F (36.8 C)  SpO2: 90% 92%    AA Ox3 CNI  Strength:5/5 throughout RLE.  4/5 L IP, KE. 5/5 else  Data:  No results for input(s): "NA", "K", "CL", "CO2", "BUN", "CREATININE", "LABGLOM", "GLUCOSE", "CALCIUM" in the last 168 hours. No results for input(s): "AST", "ALT", "ALKPHOS" in the last 168 hours.  Invalid input(s): "TBILI"   No results for input(s): "WBC", "HGB", "HCT", "PLT" in the last 168 hours. No results for input(s): "APTT", "INR" in the last 168 hours.       Other tests/results: n/a  Assessment/Plan:  Anyla Israelson is doing well after lumbar decompression  - mobilize - pain control - DVT prophylaxis - PTOT today   Meade Maw MD, [facility] Department of Neurosurgery

## 2022-04-16 NOTE — Evaluation (Signed)
Physical Therapy Evaluation Patient Details Name: Kristen Potts MRN: [id number] DOB: [birth year] Today's Date: 04/16/2022  History of Present Illness  Pt is a 76yo female s/p L4-5 lumbar decompression including central laminectomy and bilateral medial facetectomies including foraminotomies on 04/15/22, cyst removal. PMhx includes anxiety, arthritis, COPD, depression. GERD, glaucoma, HLD, HTN.   Clinical Impression  Patient alert, agreeable to PT, did display some potential HOH. Reported at baseline she is modI, her sister assists with groceries and driving, said she has had 3 falls in the last 6 months. Utilizes RW at baseline.   The patient was instructed in BLT precautions as well as log rolling technique, minA to assist due to pain (4/10 LBP). Fair sitting balance noted for several minutes. Sit <> stand with RW and CGA, effortful from low bed surface but pt able t complete without physical assistance. She ambulated ~161f with RW and CGA-supervision, noted for very decreased velocity with decreased stride length bilaterally, but no LOB.  Pt returned to room and in recliner with needs in reach at end of session. Pt unable to recall any BLT precautions. Overall the patient demonstrated deficits (see "PT Problem List") that impede the patient's functional abilities, safety, and mobility and would benefit from skilled PT intervention. Recommendation at this time is HHPT with intermittent supervision/assistance.         Recommendations for follow up therapy are one component of a multi-disciplinary discharge planning process, led by the attending physician.  Recommendations may be updated based on patient status, additional functional criteria and insurance authorization.  Follow Up Recommendations Home health PT      Assistance Recommended at Discharge Intermittent Supervision/Assistance  Patient can return home with the following  Assistance with cooking/housework;Assist for transportation;Help  with stairs or ramp for entrance;A little help with bathing/dressing/bathroom    Equipment Recommendations None recommended by PT  Recommendations for Other Services       Functional Status Assessment Patient has had a recent decline in their functional status and demonstrates the ability to make significant improvements in function in a reasonable and predictable amount of time.     Precautions / Restrictions Precautions Precautions: Fall Restrictions Weight Bearing Restrictions: No      Mobility  Bed Mobility Overal bed mobility: Needs Assistance Bed Mobility: Rolling, Sidelying to Sit Rolling: Min guard Sidelying to sit: Min assist       General bed mobility comments: trunk assist due to pain    Transfers Overall transfer level: Needs assistance Equipment used: Rolling walker (2 wheels) Transfers: Sit to/from Stand Sit to Stand: Min guard           General transfer comment: from low bed height, effortful but no help needed    Ambulation/Gait Ambulation/Gait assistance: Min guard, Supervision Gait Distance (Feet): 190 Feet Assistive device: Rolling walker (2 wheels)         General Gait Details: progressed to supervision, very decreased velocity with decreased stride length bilaterally  Stairs            Wheelchair Mobility    Modified Rankin (Stroke Patients Only)       Balance Overall balance assessment: Needs assistance Sitting-balance support: Feet supported Sitting balance-Leahy Scale: Fair       Standing balance-Leahy Scale: Fair                               Pertinent Vitals/Pain Pain Assessment Pain Assessment: 0-10 Pain Score: 4  Pain  Location: midline back Pain Descriptors / Indicators: Aching, Grimacing, Guarding Pain Intervention(s): Limited activity within patient's tolerance, Repositioned, Premedicated before session, Monitored during session    [place] expects to be discharged to::  Private residence Living Arrangements: Alone Available Help at Discharge: Family;Available PRN/intermittently (sister, son) Type of Home: House Home Access: Ramped entrance       Home Layout: One level Home Equipment: Conservation officer, nature (2 wheels);Cane - single point;Shower seat (uses counter for toilet transfers) Additional Comments: did endorse 3 falls    Prior Function Prior Level of Function : Independent/Modified Independent             Mobility Comments: modI with RW. does not drive ADLs Comments: modI     Hand Dominance        Extremity/Trunk Assessment   Upper Extremity Assessment Upper Extremity Assessment: Generalized weakness    Lower Extremity Assessment Lower Extremity Assessment: Generalized weakness       Communication   Communication: No difficulties  Cognition Arousal/Alertness: Awake/alert Behavior During Therapy: WFL for tasks assessed/performed Overall Cognitive Status: Within Functional Limits for tasks assessed                                          General Comments      Exercises     Assessment/Plan    PT Assessment Patient needs continued PT services  PT Problem List Decreased strength;Decreased mobility;Decreased activity tolerance;Decreased balance;Pain;Decreased knowledge of use of DME;Decreased knowledge of precautions;Decreased range of motion       PT Treatment Interventions DME instruction;Therapeutic exercise;Gait training;Balance training;Neuromuscular re-education;Functional mobility training;Cognitive remediation;Therapeutic activities;Patient/family education    PT Goals (Current goals can be found in the Care Plan section)  Acute Rehab PT Goals Patient Stated Goal: to go home PT Goal Formulation: With patient Time For Goal Achievement: 04/30/22 Potential to Achieve Goals: Good    Frequency 7X/week     Co-evaluation               AM-PAC PT "6 Clicks" Mobility  Outcome Measure Help  needed turning from your back to your side while in a flat bed without using bedrails?: A Little Help needed moving from lying on your back to sitting on the side of a flat bed without using bedrails?: A Little Help needed moving to and from a bed to a chair (including a wheelchair)?: A Little Help needed standing up from a chair using your arms (e.g., wheelchair or bedside chair)?: A Little Help needed to walk in hospital room?: A Little Help needed climbing 3-5 steps with a railing? : A Lot 6 Click Score: 17    End of Session Equipment Utilized During Treatment: Gait belt Activity Tolerance: Patient tolerated treatment well Patient left: in chair;with call bell/phone within reach Nurse Communication: Mobility status PT Visit Diagnosis: Other abnormalities of gait and mobility (R26.89);Difficulty in walking, not elsewhere classified (R26.2);Muscle weakness (generalized) (M62.81);Pain Pain - Right/Left:  (midline) Pain - part of body:  (back)    Time: 0177-9390 PT Time Calculation (min) (ACUTE ONLY): 25 min   Charges:   PT Evaluation $PT Eval Low Complexity: 1 Low PT Treatments $Therapeutic Activity: 23-37 mins        Lieutenant Diego PT, DPT 9:45 AM,04/16/22

## 2022-04-16 NOTE — Care Management Obs Status (Signed)
[facility] NOTIFICATION   Patient Details  Name: Kristen Potts MRN: [id number] Date of Birth: 1945-10-04   Medicare Observation Status Notification Given:  Yes    Conception Oms, RN 04/16/2022, 9:02 AM

## 2022-04-16 NOTE — Discharge Summary (Signed)
Physician Discharge Summary  Patient ID: Alleya Demeter MRN: [id number] DOB/AGE: May 04, 1946 76 y.o.  Admit date: 04/15/2022 Discharge date: 04/16/2022  Admission Diagnoses: Principal Problem:   Neurogenic claudication Active Problems:   Neurogenic claudication due to lumbar spinal stenosis   Synovial cyst of lumbar facet joint   Left leg weakness  Discharge Diagnoses:  Principal Problem:   Neurogenic claudication Active Problems:   Neurogenic claudication due to lumbar spinal stenosis   Synovial cyst of lumbar facet joint   Left leg weakness   Discharged Condition: good  Hospital Course: Ms. Nordell is a 76 yo female who presented with weakness and leg pain.  She had surgery and was admitted for observation.  She did well with PT and OT and was stable for discharge.  Consults: None  Significant Diagnostic Studies: radiology: X-Ray: localization  Treatments: surgery: L4-5 decompression  Discharge Exam: Blood pressure (!) 144/64, pulse 100, temperature 98.2 F (36.8 C), resp. rate 16, height '5\' 3"'$  (1.6 m), weight 81 kg, SpO2 91 %. General appearance: alert  Doing well MAEW BLE  Disposition: Discharge disposition: 01-Home or Self Care       Discharge Instructions     Discharge patient   Complete by: As directed    Discharge disposition: 01-Home or Self Care   Discharge patient date: 04/16/2022   Incentive spirometry RT   Complete by: As directed       Allergies as of 04/16/2022       Reactions   Codeine Nausea Only   Mirtazapine Other (See Comments)   Didn't work Other reaction(s): Dizziness, Other (See Comments) Didn't work   Sulfa Antibiotics Other (See Comments)   Tramadol Other (See Comments)   Feels bad   Benzodiazepines Anxiety        Medication List     STOP taking these medications    alendronate 70 MG tablet Commonly known as: FOSAMAX   sertraline 50 MG tablet Commonly known as: ZOLOFT       TAKE these medications     amitriptyline 25 MG tablet Commonly known as: ELAVIL Take 50 mg by mouth at bedtime.   amLODipine 5 MG tablet Commonly known as: NORVASC Take 5 mg by mouth at bedtime.   BIOFLEX PO Take 1 tablet by mouth daily.   Calcium Carbonate-Vitamin D 600-200 MG-UNIT Tabs Take 1 tablet by mouth daily.   clonazePAM 1 MG tablet Commonly known as: KLONOPIN Take 1 mg by mouth daily as needed for anxiety.   etodolac 400 MG tablet Commonly known as: LODINE Take 400 mg by mouth every morning.   Fish Oil 500 MG Caps Take 1 capsule by mouth daily.   fluticasone 50 MCG/ACT nasal spray Commonly known as: FLONASE Place 2 sprays into both nostrils daily.   furosemide 20 MG tablet Commonly known as: LASIX Take 20 mg by mouth as needed.   gabapentin 100 MG capsule Commonly known as: NEURONTIN Take 100 mg by mouth at bedtime.   methocarbamol 500 MG tablet Commonly known as: ROBAXIN Take 1 tablet (500 mg total) by mouth every 6 (six) hours as needed for muscle spasms.   metoprolol succinate 25 MG 24 hr tablet Commonly known as: TOPROL-XL Take 25 mg by mouth daily.   omeprazole 20 MG capsule Commonly known as: PRILOSEC TAKE 1 CAPSULE BY MOUTH USUALLY 30 MINUTES BEFORE BREAKFAST   oxyCODONE 5 MG immediate release tablet Commonly known as: Oxy IR/ROXICODONE Take 1 tablet (5 mg total) by mouth every 4 (four) hours as needed  for moderate pain ((score 4 to 6)).   rosuvastatin 5 MG tablet Commonly known as: CRESTOR Take 5 mg by mouth at bedtime.   vitamin B-12 500 MCG tablet Commonly known as: CYANOCOBALAMIN Take 500 mcg by mouth daily.   Vitamin D3 50 MCG (2000 UT) Tabs Take 2,000 Units by mouth daily.   Vitamin E 400 units Tabs Take by mouth daily.        Follow-up Information     Geronimo Boot, PA-C Follow up on 05/01/2022.   Specialty: Neurosurgery Why: 2 pm Contact information: [address] [place] [address] [phone]                  Signed: Meade Maw 04/16/2022, 10:11 AM

## 2022-04-16 NOTE — Plan of Care (Signed)

## 2022-04-16 NOTE — Progress Notes (Signed)
Met with the patient and her son in the room She has DME at home and does not nbeed more She is already open with Home health PT and would like to continue with them Her son will provide transportation

## 2022-04-17 ENCOUNTER — Observation Stay: Payer: Medicare HMO

## 2022-04-17 ENCOUNTER — Emergency Department: Payer: Medicare HMO

## 2022-04-17 ENCOUNTER — Other Ambulatory Visit: Payer: Self-pay

## 2022-04-17 ENCOUNTER — Inpatient Hospital Stay
Admission: EM | Admit: 2022-04-17 | Discharge: 2022-04-23 | DRG: 371 | Disposition: A | Discharge status: Skilled Nursing Facility | Payer: Medicare HMO | Attending: Internal Medicine | Admitting: Internal Medicine

## 2022-04-17 DIAGNOSIS — Z720 Tobacco use: Secondary | ICD-10-CM | POA: Diagnosis present

## 2022-04-17 DIAGNOSIS — H9190 Unspecified hearing loss, unspecified ear: Secondary | ICD-10-CM | POA: Diagnosis present

## 2022-04-17 DIAGNOSIS — Y92009 Unspecified place in unspecified non-institutional (private) residence as the place of occurrence of the external cause: Secondary | ICD-10-CM

## 2022-04-17 DIAGNOSIS — R7309 Other abnormal glucose: Secondary | ICD-10-CM | POA: Diagnosis present

## 2022-04-17 DIAGNOSIS — M7138 Other bursal cyst, other site: Secondary | ICD-10-CM | POA: Diagnosis present

## 2022-04-17 DIAGNOSIS — K5900 Constipation, unspecified: Secondary | ICD-10-CM | POA: Insufficient documentation

## 2022-04-17 DIAGNOSIS — K649 Unspecified hemorrhoids: Secondary | ICD-10-CM | POA: Diagnosis present

## 2022-04-17 DIAGNOSIS — B370 Candidal stomatitis: Secondary | ICD-10-CM

## 2022-04-17 DIAGNOSIS — F3341 Major depressive disorder, recurrent, in partial remission: Secondary | ICD-10-CM | POA: Diagnosis present

## 2022-04-17 DIAGNOSIS — I480 Paroxysmal atrial fibrillation: Secondary | ICD-10-CM | POA: Diagnosis present

## 2022-04-17 DIAGNOSIS — F419 Anxiety disorder, unspecified: Secondary | ICD-10-CM | POA: Diagnosis present

## 2022-04-17 DIAGNOSIS — A0472 Enterocolitis due to Clostridium difficile, not specified as recurrent: Principal | ICD-10-CM | POA: Diagnosis present

## 2022-04-17 DIAGNOSIS — E860 Dehydration: Secondary | ICD-10-CM | POA: Diagnosis present

## 2022-04-17 DIAGNOSIS — R296 Repeated falls: Secondary | ICD-10-CM | POA: Diagnosis present

## 2022-04-17 DIAGNOSIS — W19XXXA Unspecified fall, initial encounter: Secondary | ICD-10-CM

## 2022-04-17 DIAGNOSIS — F17210 Nicotine dependence, cigarettes, uncomplicated: Secondary | ICD-10-CM | POA: Diagnosis present

## 2022-04-17 DIAGNOSIS — R197 Diarrhea, unspecified: Secondary | ICD-10-CM

## 2022-04-17 DIAGNOSIS — K219 Gastro-esophageal reflux disease without esophagitis: Secondary | ICD-10-CM | POA: Diagnosis present

## 2022-04-17 DIAGNOSIS — H409 Unspecified glaucoma: Secondary | ICD-10-CM | POA: Diagnosis present

## 2022-04-17 DIAGNOSIS — Z1152 Encounter for screening for COVID-19: Secondary | ICD-10-CM

## 2022-04-17 DIAGNOSIS — R000 Tachycardia, unspecified: Secondary | ICD-10-CM | POA: Diagnosis present

## 2022-04-17 DIAGNOSIS — I10 Essential (primary) hypertension: Secondary | ICD-10-CM | POA: Diagnosis not present

## 2022-04-17 DIAGNOSIS — M549 Dorsalgia, unspecified: Secondary | ICD-10-CM | POA: Diagnosis present

## 2022-04-17 DIAGNOSIS — Z6830 Body mass index (BMI) 30.0-30.9, adult: Secondary | ICD-10-CM

## 2022-04-17 DIAGNOSIS — E669 Obesity, unspecified: Secondary | ICD-10-CM | POA: Diagnosis present

## 2022-04-17 DIAGNOSIS — K8020 Calculus of gallbladder without cholecystitis without obstruction: Secondary | ICD-10-CM | POA: Diagnosis present

## 2022-04-17 DIAGNOSIS — Z7901 Long term (current) use of anticoagulants: Secondary | ICD-10-CM

## 2022-04-17 DIAGNOSIS — Z885 Allergy status to narcotic agent status: Secondary | ICD-10-CM

## 2022-04-17 DIAGNOSIS — G9341 Metabolic encephalopathy: Secondary | ICD-10-CM | POA: Diagnosis present

## 2022-04-17 DIAGNOSIS — E876 Hypokalemia: Secondary | ICD-10-CM | POA: Diagnosis present

## 2022-04-17 DIAGNOSIS — R7989 Other specified abnormal findings of blood chemistry: Secondary | ICD-10-CM | POA: Diagnosis present

## 2022-04-17 DIAGNOSIS — W010XXA Fall on same level from slipping, tripping and stumbling without subsequent striking against object, initial encounter: Secondary | ICD-10-CM | POA: Diagnosis present

## 2022-04-17 DIAGNOSIS — R339 Retention of urine, unspecified: Secondary | ICD-10-CM | POA: Diagnosis present

## 2022-04-17 DIAGNOSIS — B379 Candidiasis, unspecified: Secondary | ICD-10-CM | POA: Diagnosis present

## 2022-04-17 DIAGNOSIS — R29898 Other symptoms and signs involving the musculoskeletal system: Secondary | ICD-10-CM | POA: Diagnosis present

## 2022-04-17 DIAGNOSIS — R9431 Abnormal electrocardiogram [ECG] [EKG]: Secondary | ICD-10-CM | POA: Diagnosis present

## 2022-04-17 DIAGNOSIS — M170 Bilateral primary osteoarthritis of knee: Secondary | ICD-10-CM | POA: Diagnosis present

## 2022-04-17 DIAGNOSIS — R509 Fever, unspecified: Secondary | ICD-10-CM | POA: Diagnosis present

## 2022-04-17 DIAGNOSIS — Z882 Allergy status to sulfonamides status: Secondary | ICD-10-CM

## 2022-04-17 DIAGNOSIS — J431 Panlobular emphysema: Secondary | ICD-10-CM | POA: Diagnosis present

## 2022-04-17 DIAGNOSIS — Z972 Presence of dental prosthetic device (complete) (partial): Secondary | ICD-10-CM

## 2022-04-17 DIAGNOSIS — Z888 Allergy status to other drugs, medicaments and biological substances status: Secondary | ICD-10-CM

## 2022-04-17 DIAGNOSIS — E782 Mixed hyperlipidemia: Secondary | ICD-10-CM | POA: Diagnosis present

## 2022-04-17 LAB — HEPATIC FUNCTION PANEL
ALT: 11 U/L (ref 0–44)
AST: 23 U/L (ref 15–41)
Albumin: 3.1 g/dL — ABNORMAL LOW (ref 3.5–5.0)
Alkaline Phosphatase: 101 U/L (ref 38–126)
Bilirubin, Direct: 0.2 mg/dL (ref 0.0–0.2)
Indirect Bilirubin: 0.7 mg/dL (ref 0.3–0.9)
Total Bilirubin: 0.9 mg/dL (ref 0.3–1.2)
Total Protein: 6 g/dL — ABNORMAL LOW (ref 6.5–8.1)

## 2022-04-17 LAB — MAGNESIUM: Magnesium: 1.7 mg/dL (ref 1.7–2.4)

## 2022-04-17 LAB — CBC
HCT: 41.8 % (ref 36.0–46.0)
Hemoglobin: 13.2 g/dL (ref 12.0–15.0)
MCH: 28.8 pg (ref 26.0–34.0)
MCHC: 31.6 g/dL (ref 30.0–36.0)
MCV: 91.1 fL (ref 80.0–100.0)
Platelets: 154 10*3/uL (ref 150–400)
RBC: 4.59 MIL/uL (ref 3.87–5.11)
RDW: 13.1 % (ref 11.5–15.5)
WBC: 8.4 10*3/uL (ref 4.0–10.5)
nRBC: 0 % (ref 0.0–0.2)

## 2022-04-17 LAB — T4, FREE: Free T4: 1.44 ng/dL — ABNORMAL HIGH (ref 0.61–1.12)

## 2022-04-17 LAB — URINALYSIS, ROUTINE W REFLEX MICROSCOPIC
Bilirubin Urine: NEGATIVE
Glucose, UA: NEGATIVE mg/dL
Hgb urine dipstick: NEGATIVE
Ketones, ur: 5 mg/dL — AB
Leukocytes,Ua: NEGATIVE
Nitrite: NEGATIVE
Protein, ur: NEGATIVE mg/dL
Specific Gravity, Urine: 1.021 (ref 1.005–1.030)
pH: 5 (ref 5.0–8.0)

## 2022-04-17 LAB — BASIC METABOLIC PANEL
Anion gap: 6 (ref 5–15)
BUN: 16 mg/dL (ref 8–23)
CO2: 26 mmol/L (ref 22–32)
Calcium: 8.4 mg/dL — ABNORMAL LOW (ref 8.9–10.3)
Chloride: 105 mmol/L (ref 98–111)
Creatinine, Ser: 0.89 mg/dL (ref 0.44–1.00)
GFR, Estimated: >60 mL/min (ref >60)
Glucose, Bld: 108 mg/dL — ABNORMAL HIGH (ref 70–99)
Potassium: 3.1 mmol/L — ABNORMAL LOW (ref 3.5–5.1)
Sodium: 137 mmol/L (ref 135–145)

## 2022-04-17 LAB — SARS CORONAVIRUS 2 BY RT PCR: SARS Coronavirus 2 by RT PCR: NEGATIVE

## 2022-04-17 LAB — TROPONIN I (HIGH SENSITIVITY)
Troponin I (High Sensitivity): 26 ng/L — ABNORMAL HIGH (ref <18)
Troponin I (High Sensitivity): 25 ng/L — ABNORMAL HIGH (ref <18)
Troponin I (High Sensitivity): 34 ng/L — ABNORMAL HIGH (ref <18)

## 2022-04-17 LAB — TYPE AND SCREEN
ABO/RH(D): A POS
Antibody Screen: NEGATIVE

## 2022-04-17 LAB — BRAIN NATRIURETIC PEPTIDE: B Natriuretic Peptide: 212.8 pg/mL — ABNORMAL HIGH (ref 0.0–100.0)

## 2022-04-17 LAB — TSH: TSH: 2.28 u[IU]/mL (ref 0.350–4.500)

## 2022-04-17 LAB — LACTIC ACID, PLASMA: Lactic Acid, Venous: 1.9 mmol/L (ref 0.5–1.9)

## 2022-04-17 MED ORDER — ACETAMINOPHEN 650 MG RE SUPP: 650.0000 mg | Freq: Four times a day (QID) | RECTAL | Status: DC | PRN

## 2022-04-17 MED ORDER — METOPROLOL TARTRATE 25 MG PO TABS
12.5000 mg | ORAL_TABLET | Freq: Once | ORAL | Status: DC
  Filled 2022-04-17: qty 1

## 2022-04-17 MED ORDER — SODIUM CHLORIDE 0.9 % IV SOLN: 8.0000 mg | Freq: Three times a day (TID) | INTRAVENOUS | Status: DC | PRN

## 2022-04-17 MED ORDER — POTASSIUM CHLORIDE 10 MEQ/100ML IV SOLN
10.0000 meq | Freq: Once | INTRAVENOUS | Status: AC
  Administered 2022-04-17: 10 meq via INTRAVENOUS
  Filled 2022-04-17: qty 100

## 2022-04-17 MED ORDER — SODIUM CHLORIDE 0.9 % IV SOLN
2.0000 g | Freq: Once | INTRAVENOUS | Status: AC
  Administered 2022-04-17: 2 g via INTRAVENOUS
  Filled 2022-04-17: qty 20

## 2022-04-17 MED ORDER — PANTOPRAZOLE SODIUM 40 MG IV SOLR
40.0000 mg | Freq: Two times a day (BID) | INTRAVENOUS | Status: DC
  Administered 2022-04-17 – 2022-04-23 (×11): 40 mg via INTRAVENOUS
  Filled 2022-04-17 (×12): qty 10

## 2022-04-17 MED ORDER — SODIUM CHLORIDE 0.9 % IV BOLUS
1000.0000 mL | Freq: Once | INTRAVENOUS | Status: AC
  Administered 2022-04-17: 1000 mL via INTRAVENOUS

## 2022-04-17 MED ORDER — IOHEXOL 350 MG/ML SOLN
100.0000 mL | Freq: Once | INTRAVENOUS | Status: AC | PRN
  Administered 2022-04-17: 100 mL via INTRAVENOUS

## 2022-04-17 MED ORDER — SODIUM CHLORIDE 0.9 % IV BOLUS
500.0000 mL | Freq: Once | INTRAVENOUS | Status: AC
  Administered 2022-04-17: 500 mL via INTRAVENOUS

## 2022-04-17 MED ORDER — POTASSIUM CHLORIDE CRYS ER 20 MEQ PO TBCR
40.0000 meq | EXTENDED_RELEASE_TABLET | Freq: Once | ORAL | Status: DC
  Filled 2022-04-17: qty 2

## 2022-04-17 MED ORDER — SODIUM CHLORIDE 0.9 % IV SOLN: 1.0000 g | Freq: Once | INTRAVENOUS | Status: DC

## 2022-04-17 MED ORDER — FLUTICASONE PROPIONATE 50 MCG/ACT NA SUSP
2.0000 | Freq: Every day | NASAL | Status: DC
  Administered 2022-04-20 – 2022-04-23 (×4): 2 via NASAL
  Filled 2022-04-17 (×2): qty 16

## 2022-04-17 MED ORDER — AMITRIPTYLINE HCL 25 MG PO TABS
50.0000 mg | ORAL_TABLET | Freq: Every day | ORAL | Status: DC
  Administered 2022-04-17 – 2022-04-22 (×6): 50 mg via ORAL
  Filled 2022-04-17: qty 1
  Filled 2022-04-17 (×5): qty 2

## 2022-04-17 MED ORDER — ACETAMINOPHEN 325 MG PO TABS
650.0000 mg | ORAL_TABLET | Freq: Four times a day (QID) | ORAL | Status: DC | PRN
  Administered 2022-04-18 – 2022-04-21 (×2): 650 mg via ORAL
  Filled 2022-04-17 (×3): qty 2

## 2022-04-17 MED ORDER — HEPARIN SODIUM (PORCINE) 5000 UNIT/ML IJ SOLN: 5000.0000 [IU] | Freq: Three times a day (TID) | INTRAMUSCULAR | Status: DC

## 2022-04-17 MED ORDER — MAGNESIUM SULFATE 2 GM/50ML IV SOLN
2.0000 g | Freq: Once | INTRAVENOUS | Status: AC
  Administered 2022-04-17: 2 g via INTRAVENOUS
  Filled 2022-04-17: qty 50

## 2022-04-17 MED ORDER — LORAZEPAM 2 MG/ML IJ SOLN
1.0000 mg | Freq: Once | INTRAMUSCULAR | Status: AC | PRN
  Administered 2022-04-17: 1 mg via INTRAVENOUS
  Filled 2022-04-17: qty 1

## 2022-04-17 MED ORDER — NICOTINE 21 MG/24HR TD PT24
21.0000 mg | MEDICATED_PATCH | Freq: Every day | TRANSDERMAL | Status: DC
  Administered 2022-04-17 – 2022-04-23 (×3): 21 mg via TRANSDERMAL
  Filled 2022-04-17 (×6): qty 1

## 2022-04-17 NOTE — ED Notes (Signed)
Pt bladder scan showing 150-245m in bladder. MD made aware

## 2022-04-17 NOTE — ED Provider Notes (Signed)
Assumed care from Dr. Nickolas Madrid.  Briefly, 76 year old female here with generalized weakness in the setting of recent back surgery.  Patient interestingly has acute urinary retention but intact distal strength and sensation in the bilateral lower extremities, do not suspect cauda equina clinically.  She does appear dehydrated as well, will give fluids, place Foley for her retention, and plan to admit to medicine.  Regarding her weakness, I discussed her presentation and exam extensively with Dr. Cari Caraway of neurosurgery.  Patient is highly unlikely to have acute complication, infection, hematoma, or cauda equina per this discussion and no imaging indicated at this time.  Patient examined serially with intact strength and sensation in her bilateral lower extremities.  Regarding the etiology of her urinary retention, differential includes possible constipation, anesthesia effect, medication effect.  Foley draining urine now. Admit to medicine.   Duffy Bruce, MD 04/17/22 2347

## 2022-04-17 NOTE — ED Notes (Signed)
Lab called to add on blood work 

## 2022-04-17 NOTE — ED Notes (Signed)
Pt assisted on bedpan to attempt to have BM. Pt directed to hit call bell once done.

## 2022-04-17 NOTE — ED Provider Notes (Signed)
[facility] Provider Note    Event Date/Time   First MD Initiated Contact with Patient 04/17/22 1251     (approximate)   History   Post-op Problem and Atrial Fibrillation   HPI  Kristen Potts is a 76 y.o. female with history of surgery on 10/11 with Dr. Cari Caraway secondary to spinal stenosis who now comes in with postop problem and new atrial fibrillation.  Patient reports that she was going to the bathroom when she reports that she was trying to sit down on the toilet and she slipped and landed on her butt.  She does not think she hit her head however she reports increasing weakness in her bilateral legs.  She denies having this previously.  She reports ambulating with a walker.  She is does not feel like she is got really pain in her hips is more just weakness in both of her legs.  She denies any numbness or tingling.  Patient with EMS noted to be in new A-fib with RVR with rates of 190 but after getting fluids her heart rates have come down to 110 and seem more sinus in nature.  Physical Exam   Triage Vital Signs: Blood pressure 112/60, pulse (!) 111, temperature 98.1 F (36.7 C), temperature source Oral, resp. rate 20, SpO2 92 %.   Most recent vital signs: Vitals:   04/17/22 1256 04/17/22 1300  BP:  112/60  Pulse: (!) 108 (!) 111  Resp: 18 20  Temp: 98.1 F (36.7 C)   SpO2: 90% 92%     General: Awake, no distress.  CV:  Good peripheral perfusion.  Tachycardic Resp:  Normal effort.  Abd:  No distention.  Soft and nontender Other:  Patient has some difficulty lifting up both legs up off the bed.  Sensation intact bilaterally.  Maybe a little bit of right hip tenderness.  Cranial nerves II through XII are intact equally.   ED Results / Procedures / Treatments   Labs (all labs ordered are listed, but only abnormal results are displayed) Labs Reviewed  BASIC METABOLIC PANEL  CBC  HEPATIC FUNCTION PANEL  MAGNESIUM  URINALYSIS, ROUTINE W  REFLEX MICROSCOPIC  TSH  T4, FREE  TROPONIN I (HIGH SENSITIVITY)     EKG  My interpretation of EKG:  EKG appears to be a sinus tachycardic with occasional PVCs, no ST elevation, no T wave inversions, normal intervals  RADIOLOGY I have reviewed the xray personally and interpreted no evidence of any pneumonia  PROCEDURES:  Critical Care performed: No  Procedures   MEDICATIONS ORDERED IN ED: Medications - No data to display   IMPRESSION / MDM / [place] / ED COURSE  I reviewed the triage vital signs and the nursing notes.   Patient's presentation is most consistent with acute presentation with potential threat to life or bodily function.   Patient comes in with some confusion, fall, leg weakness.  Differential is broad.  She appears to be more normal sinus rate now than in atrial fibrillation.  Will get CT imaging to evaluate for any injuries as well as CT PE given continued tachycardia.  CT head and neck are negative CT chest abdomen pelvis shows no evidence of PE there was concern for some subcutaneous gas in the right lower abdominal wall likely related to subcutaneous injections also some chronic calcifications.  Patient had off to oncoming team pending urine, seen at the signs of retention and if so consideration of MRI  Patient's labs CBC  no signs for infection BMP reassuring.  Troponin slightly elevated could be demand.  T4 slightly elevated.    The patient is on the cardiac monitor to evaluate for evidence of arrhythmia and/or significant heart rate changes.      FINAL CLINICAL IMPRESSION(S) / ED DIAGNOSES   Final diagnoses:  Fall, initial encounter     Rx / DC Orders   ED Discharge Orders     None        Note:  This document was prepared using Dragon voice recognition software and may include unintentional dictation errors.   Vanessa Jeff, MD 04/17/22 [id number]

## 2022-04-17 NOTE — ED Notes (Signed)
Pt transported to CT ?

## 2022-04-17 NOTE — H&P (Incomplete)
History and Physical    Chief Complaint: Fall    HISTORY OF PRESENT ILLNESS: Kristen Potts is an 76 y.o. female   who had elective back surgery for spinal cyst and d/c yesterday to home.  Per family pt has not had Bm in 4 days and has not urinated , at baseline pt void frequently every flew hours.  Pt is alert and oriented but has hearing issue and family at bedside is very helpful and communicating with her As well. Pt give vague history of fall and left leg giving out NAD also thinks there may be water she slipped on. She lives with a friend and family at bedside.  + smoking. -BC/ [place] before surgery: left leg weak one: 3  Falls after surgery: 1 today.    Pt has PMH as below: Past Medical History:  Diagnosis Date   Anxiety    Arthritis    knees   COPD (chronic obstructive pulmonary disease) (HCC)    Depression    GERD (gastroesophageal reflux disease)    Glaucoma    Hyperlipidemia    Hypertension    Vocal cord polyp    Wears dentures    full upper and lower     Review of Systems  Neurological:  Positive for weakness.       Left leg became weak and she fell.        Allergies  Allergen Reactions   Codeine Nausea Only   Mirtazapine Other (See Comments)    Didn't work Other reaction(s): Dizziness, Other (See Comments) Didn't work   Sulfa Antibiotics Other (See Comments)   Tramadol Other (See Comments)    Feels bad   Benzodiazepines Anxiety     Past Surgical History:  Procedure Laterality Date   CATARACT EXTRACTION     CATARACT EXTRACTION W/PHACO Left 03/28/2019   Procedure: CATARACT EXTRACTION PHACO AND INTRAOCULAR LENS PLACEMENT (IOC) LEFT  01:03.9  15.0%  9.59;  Surgeon: Birder Robson, MD;  Location: [place];  Service: Ophthalmology;  Laterality: Left;   COLONOSCOPY  11/08/2006   COLONOSCOPY W/ POLYPECTOMY  04/06/2014   adenomatous polyps, family history of colon polyps   COLONOSCOPY WITH PROPOFOL N/A 11/10/2017    Procedure: COLONOSCOPY WITH PROPOFOL;  Surgeon: Manya Silvas, MD;  Location: [facility] ENDOSCOPY;  Service: Endoscopy;  Laterality: N/A;   LUMBAR LAMINECTOMY/DECOMPRESSION MICRODISCECTOMY N/A 04/15/2022   Procedure: L4-5 DECOMPRESSION;  Surgeon: Meade Maw, MD;  Location: [facility];  Service: Neurosurgery;  Laterality: N/A;   TONSILLECTOMY     and adenoidectomy   vocal cord polypectomy  2012      Social History   Socioeconomic History   Marital status: Widowed    Spouse name: Not on file   Number of children: 2   Years of education: Not on file   Highest education level: 11th grade  Occupational History    Comment: retired  Tobacco Use   Smoking status: Every Day    Packs/day: 1.00    Years: 50.00    Total pack years: 50.00    Types: Cigarettes   Smokeless tobacco: Never  Vaping Use   Vaping Use: Never used  Substance and Sexual Activity   Alcohol use: Never   Drug use: Never   Sexual activity: Not Currently  Other Topics Concern   Not on file  Social History Narrative   Not on file   Social Determinants of Health   Financial Resource Strain: Low Risk  (12/19/2018)   Overall Financial  Resource Strain (CARDIA)    Difficulty of Paying Living Expenses: Not hard at all  Food Insecurity: No Food Insecurity (04/18/2022)   Hunger Vital Sign    Worried About Running Out of Food in the Last Year: Never true    Ran Out of Food in the Last Year: Never true  Transportation Needs: No Transportation Needs (04/18/2022)   PRAPARE - Hydrologist (Medical): No    Lack of Transportation (Non-Medical): No  Physical Activity: Inactive (12/19/2018)   Exercise Vital Sign    Days of Exercise per Week: 0 days    Minutes of Exercise per Session: 0 min  Stress: Stress Concern Present (12/19/2018)   [place]    Feeling of Stress : Very much  Social Connections: Somewhat Isolated  (12/19/2018)   Social Connection and Isolation Panel [NHANES]    Frequency of Communication with Friends and Family: More than three times a week    Frequency of Social Gatherings with Friends and Family: More than three times a week    Attends Religious Services: More than 4 times per year    Active Member of [employer] or Organizations: No    Attends Archivist Meetings: Never    Marital Status: Widowed      CURRENT MEDS:    Current Facility-Administered Medications (Cardiovascular):    metoprolol tartrate (LOPRESSOR) tablet 12.5 mg   Current Facility-Administered Medications (Respiratory):    fluticasone (FLONASE) 50 MCG/ACT nasal spray 2 spray   Current Facility-Administered Medications (Analgesics):    acetaminophen (TYLENOL) tablet 650 mg **OR** acetaminophen (TYLENOL) suppository 650 mg     Current Facility-Administered Medications (Other):    0.9 %  sodium chloride infusion   amitriptyline (ELAVIL) tablet 50 mg   cefTRIAXone (ROCEPHIN) 1 g in sodium chloride 0.9 % 100 mL IVPB   nicotine (NICODERM CQ - dosed in mg/24 hours) patch 21 mg   ondansetron (ZOFRAN) 8 mg in sodium chloride 0.9 % 50 mL IVPB   pantoprazole (PROTONIX) injection 40 mg   potassium chloride 10 mEq in 100 mL IVPB   potassium chloride SA (KLOR-CON M) CR tablet 40 mEq  No current outpatient medications on file.    ED Course: Pt in Ed patient appears dehydrated tachycardic with O2 sats. Patient is alert but very hard of hearing. Vitals:   04/17/22 2100 04/17/22 2210 04/17/22 2242 04/18/22 0021  BP: (!) 151/93 (!) 151/78  136/74  Pulse: (!) 56 69    Resp: 18 15    Temp:   99.3 F (37.4 C) 98.5 F (36.9 C)  TempSrc:   Oral Oral  SpO2: 100% 98%  95%  Weight:    77.6 kg  Height:    '5\' 3"'$  (1.6 m)   Total I/O In: 1096 [IV Piggyback:1096] Out: 2175 [Urine:2175] SpO2: 95 % Blood work in ed shows electrolyte abnormalities with hypokalemia, normal BMP, normal CBC BC, LFTs, magnesium level  and 1.7, troponin 26, additional orders to follow.potassium changed to iv as pt  Vomited.  Results for orders placed or performed during the hospital encounter of 04/17/22 (from the past 24 hour(s))  Basic metabolic panel     Status: Abnormal   Collection Time: 04/17/22 12:59 PM  Result Value Ref Range   Sodium 137 135 - 145 mmol/L   Potassium 3.1 (L) 3.5 - 5.1 mmol/L   Chloride 105 98 - 111 mmol/L   CO2 26 22 - 32 mmol/L  Glucose, Bld 108 (H) 70 - 99 mg/dL   BUN 16 8 - 23 mg/dL   Creatinine, Ser 0.89 0.44 - 1.00 mg/dL   Calcium 8.4 (L) 8.9 - 10.3 mg/dL   GFR, Estimated >60 >60 mL/min   Anion gap 6 5 - 15  CBC     Status: None   Collection Time: 04/17/22 12:59 PM  Result Value Ref Range   WBC 8.4 4.0 - 10.5 K/uL   RBC 4.59 3.87 - 5.11 MIL/uL   Hemoglobin 13.2 12.0 - 15.0 g/dL   HCT 41.8 36.0 - 46.0 %   MCV 91.1 80.0 - 100.0 fL   MCH 28.8 26.0 - 34.0 pg   MCHC 31.6 30.0 - 36.0 g/dL   RDW 13.1 11.5 - 15.5 %   Platelets 154 150 - 400 K/uL   nRBC 0.0 0.0 - 0.2 %  Hepatic function panel     Status: Abnormal   Collection Time: 04/17/22 12:59 PM  Result Value Ref Range   Total Protein 6.0 (L) 6.5 - 8.1 g/dL   Albumin 3.1 (L) 3.5 - 5.0 g/dL   AST 23 15 - 41 U/L   ALT 11 0 - 44 U/L   Alkaline Phosphatase 101 38 - 126 U/L   Total Bilirubin 0.9 0.3 - 1.2 mg/dL   Bilirubin, Direct 0.2 0.0 - 0.2 mg/dL   Indirect Bilirubin 0.7 0.3 - 0.9 mg/dL  Magnesium     Status: None   Collection Time: 04/17/22 12:59 PM  Result Value Ref Range   Magnesium 1.7 1.7 - 2.4 mg/dL  Troponin I (High Sensitivity)     Status: Abnormal   Collection Time: 04/17/22 12:59 PM  Result Value Ref Range   Troponin I (High Sensitivity) 26 (H) <18 ng/L  TSH     Status: None   Collection Time: 04/17/22 12:59 PM  Result Value Ref Range   TSH 2.280 0.350 - 4.500 uIU/mL  T4, free     Status: Abnormal   Collection Time: 04/17/22 12:59 PM  Result Value Ref Range   Free T4 1.44 (H) 0.61 - 1.12 ng/dL  Urinalysis,  Routine w reflex microscopic Anterior Nasal Swab     Status: Abnormal   Collection Time: 04/17/22  4:33 PM  Result Value Ref Range   Color, Urine YELLOW (A) YELLOW   APPearance CLEAR (A) CLEAR   Specific Gravity, Urine 1.021 1.005 - 1.030   pH 5.0 5.0 - 8.0   Glucose, UA NEGATIVE NEGATIVE mg/dL   Hgb urine dipstick NEGATIVE NEGATIVE   Bilirubin Urine NEGATIVE NEGATIVE   Ketones, ur 5 (A) NEGATIVE mg/dL   Protein, ur NEGATIVE NEGATIVE mg/dL   Nitrite NEGATIVE NEGATIVE   Leukocytes,Ua NEGATIVE NEGATIVE  Troponin I (High Sensitivity)     Status: Abnormal   Collection Time: 04/17/22  4:33 PM  Result Value Ref Range   Troponin I (High Sensitivity) 25 (H) <18 ng/L  SARS Coronavirus 2 by RT PCR (hospital order, performed in [place] hospital lab) *cepheid single result test* Anterior Nasal Swab     Status: None   Collection Time: 04/17/22  4:33 PM   Specimen: Anterior Nasal Swab  Result Value Ref Range   SARS Coronavirus 2 by RT PCR NEGATIVE NEGATIVE  Lactic acid, plasma     Status: None   Collection Time: 04/17/22  5:23 PM  Result Value Ref Range   Lactic Acid, Venous 1.9 0.5 - 1.9 mmol/L  Brain natriuretic peptide     Status: Abnormal  Collection Time: 04/17/22  8:13 PM  Result Value Ref Range   B Natriuretic Peptide 212.8 (H) 0.0 - 100.0 pg/mL  Troponin I (High Sensitivity)     Status: Abnormal   Collection Time: 04/17/22  8:34 PM  Result Value Ref Range   Troponin I (High Sensitivity) 34 (H) <18 ng/L  Type and screen     Status: None   Collection Time: 04/17/22  8:34 PM  Result Value Ref Range   ABO/RH(D) A POS    Antibody Screen NEG    Sample Expiration      04/20/2022,2359 Performed at [facility], [address] [place], [place] [address]   Comprehensive metabolic panel     Status: Abnormal   Collection Time: 04/18/22  1:06 AM  Result Value Ref Range   Sodium 135 135 - 145 mmol/L   Potassium 2.6 (LL) 3.5 - 5.1 mmol/L   Chloride 104 98 - 111 mmol/L    CO2 21 (L) 22 - 32 mmol/L   Glucose, Bld 105 (H) 70 - 99 mg/dL   BUN 7 (L) 8 - 23 mg/dL   Creatinine, Ser 0.50 0.44 - 1.00 mg/dL   Calcium 7.5 (L) 8.9 - 10.3 mg/dL   Total Protein 5.6 (L) 6.5 - 8.1 g/dL   Albumin 2.9 (L) 3.5 - 5.0 g/dL   AST 18 15 - 41 U/L   ALT 10 0 - 44 U/L   Alkaline Phosphatase 103 38 - 126 U/L   Total Bilirubin 0.9 0.3 - 1.2 mg/dL   GFR, Estimated >60 >60 mL/min   Anion gap 10 5 - 15  CBC     Status: Abnormal   Collection Time: 04/18/22  1:06 AM  Result Value Ref Range   WBC 9.1 4.0 - 10.5 K/uL   RBC 4.70 3.87 - 5.11 MIL/uL   Hemoglobin 13.5 12.0 - 15.0 g/dL   HCT 41.5 36.0 - 46.0 %   MCV 88.3 80.0 - 100.0 fL   MCH 28.7 26.0 - 34.0 pg   MCHC 32.5 30.0 - 36.0 g/dL   RDW 12.9 11.5 - 15.5 %   Platelets 144 (L) 150 - 400 K/uL   nRBC 0.0 0.0 - 0.2 %  Troponin I (High Sensitivity)     Status: Abnormal   Collection Time: 04/18/22  1:06 AM  Result Value Ref Range   Troponin I (High Sensitivity) 33 (H) <18 ng/L  Magnesium     Status: None   Collection Time: 04/18/22  1:06 AM  Result Value Ref Range   Magnesium 2.2 1.7 - 2.4 mg/dL   In Ed pt received  Meds ordered this encounter  Medications   iohexol (OMNIPAQUE) 350 MG/ML injection 100 mL   sodium chloride 0.9 % bolus 500 mL   cefTRIAXone (ROCEPHIN) 2 g in sodium chloride 0.9 % 100 mL IVPB    Order Specific Question:   Antibiotic Indication:    Answer:   UTI   sodium chloride 0.9 % bolus 1,000 mL   potassium chloride SA (KLOR-CON M) CR tablet 40 mEq   metoprolol tartrate (LOPRESSOR) tablet 12.5 mg   potassium chloride 10 mEq in 100 mL IVPB   pantoprazole (PROTONIX) injection 40 mg   amitriptyline (ELAVIL) tablet 50 mg   fluticasone (FLONASE) 50 MCG/ACT nasal spray 2 spray   DISCONTD: heparin injection 5,000 Units   OR Linked Order Group    acetaminophen (TYLENOL) tablet 650 mg    acetaminophen (TYLENOL) suppository 650 mg   cefTRIAXone (ROCEPHIN) 1  g in sodium chloride 0.9 % 100 mL IVPB    Order  Specific Question:   Antibiotic Indication:    Answer:   UTI   LORazepam (ATIVAN) injection 1 mg   ondansetron (ZOFRAN) 8 mg in sodium chloride 0.9 % 50 mL IVPB   nicotine (NICODERM CQ - dosed in mg/24 hours) patch 21 mg   magnesium sulfate IVPB 2 g 50 mL   potassium chloride SA (KLOR-CON M) CR tablet 40 mEq   DISCONTD: potassium chloride 10 mEq in 100 mL IVPB   0.9 %  sodium chloride infusion    Carrier Fluid Protocol   potassium chloride 10 mEq in 100 mL IVPB    Unresulted Labs (From admission, onward)     Start     Ordered   04/17/22 2030  pH, gastric fluid (gastroccult card)  Once,   R        04/17/22 2030   04/17/22 1903  Hemoglobin A1c  Add-on,   AD        04/17/22 1902             Admission Imaging : MR BRAIN WO CONTRAST  Result Date: 04/17/2022 CLINICAL DATA:  Transient ischemic attack EXAM: MRI HEAD WITHOUT CONTRAST TECHNIQUE: Multiplanar, multiecho pulse sequences of the brain and surrounding structures were obtained without intravenous contrast. COMPARISON:  None Available. FINDINGS: Brain: No acute infarct, mass effect or extra-axial collection. No acute or chronic hemorrhage. There is multifocal hyperintense T2-weighted signal within the white matter. Generalized volume loss. The midline structures are normal. Vascular: Major flow voids are preserved. Skull and upper cervical spine: Normal calvarium and skull base. Visualized upper cervical spine and soft tissues are normal. Sinuses/Orbits:No paranasal sinus fluid levels or advanced mucosal thickening. No mastoid or middle ear effusion. Normal orbits. IMPRESSION: 1. No acute intracranial abnormality. 2. Findings of chronic small vessel ischemia and volume loss. Electronically Signed   By: Ulyses Jarred M.D.   On: 04/17/2022 21:56   CT Angio Chest PE W and/or Wo Contrast  Result Date: 04/17/2022 CLINICAL DATA:  Acute abdominal pain EXAM: CT ANGIOGRAPHY CHEST CT ABDOMEN AND PELVIS WITH CONTRAST TECHNIQUE: Multidetector  CT imaging of the chest was performed using the standard protocol during bolus administration of intravenous contrast. Multiplanar CT image reconstructions and MIPs were obtained to evaluate the vascular anatomy. Multidetector CT imaging of the abdomen and pelvis was performed using the standard protocol during bolus administration of intravenous contrast. RADIATION DOSE REDUCTION: This exam was performed according to the departmental dose-optimization program which includes automated exposure control, adjustment of the mA and/or kV according to patient size and/or use of iterative reconstruction technique. CONTRAST:  166m OMNIPAQUE IOHEXOL 350 MG/ML SOLN COMPARISON:  None available FINDINGS: CTA CHEST FINDINGS Cardiovascular: Evidence of pulmonary embolus. Normal heart size. No pericardial effusion. Moderate coronary artery calcifications of the LAD and RCA. Normal caliber thoracic aorta with moderate calcified plaque. Mediastinum/Nodes: Esophagus and thyroid are unremarkable. No pathologically enlarged lymph nodes seen in the chest. Lungs/Pleura: Central airways are patent. Mild centrilobular emphysema. No consolidation, pleural effusion or pneumothorax. Atelectasis of the lingula. Musculoskeletal: No chest wall abnormality. Mildly displaced fractures of the posterolateral right 11th and 10th ribs with evidence of callus formation. Review of the MIP images confirms the above findings. CT ABDOMEN and PELVIS FINDINGS Hepatobiliary: No suspicious liver lesions. Gallstones and gallbladder sludge with a 7 mm stone seen in the gallbladder neck on series 6, image 45. No gallbladder wall thickening. No biliary ductal dilation. Pancreas:  Unremarkable. No pancreatic ductal dilatation or surrounding inflammatory changes. Spleen: Normal in size without focal abnormality. Adrenals/Urinary Tract: Bilateral adrenal glands are unremarkable. No hydronephrosis or nephrolithiasis. Tiny bilateral low-attenuation renal lesions which  are too small to completely characterize but likely simple cysts, no further follow-up imaging is recommended. Stomach/Bowel: Stomach is within normal limits. Appendix appears normal. No evidence of bowel wall thickening, distention, or inflammatory changes. Vascular/Lymphatic: Aortic atherosclerosis. No enlarged abdominal or pelvic lymph nodes. Reproductive: Uterus and bilateral adnexa are unremarkable. Other: Small locule of subcutaneous gas seen in the right lower abdominal wall on series 3, image 70, likely related to subcutaneous injection. Trace abdominal ascites. Musculoskeletal: No acute or significant osseous findings. Review of the MIP images confirms the above findings. IMPRESSION: 1. No evidence of pulmonary embolus or acute airspace opacity. 2. Cholelithiasis with a 7 mm stone seen in the gallbladder neck, although there is no evidence of gallbladder wall thickening. Correlate for symptoms of right upper quadrant pain and consider gallbladder ultrasound for further evaluation. 3. Trace abdominal ascites. 4. Small locule of subcutaneous gas seen in the right lower abdominal wall, likely related to subcutaneous injection. Correlate clinically. 5. Mildly displaced fractures of the posterolateral right 11th and 10th ribs with evidence of callus formation, likely subacute/chronic. 6. Moderate coronary artery calcifications of the LAD and RCA. 7. Aortic Atherosclerosis (ICD10-I70.0) and Emphysema (ICD10-J43.9). Electronically Signed   By: Yetta Glassman M.D.   On: 04/17/2022 15:50   CT ABDOMEN PELVIS W CONTRAST  Result Date: 04/17/2022 CLINICAL DATA:  Acute abdominal pain EXAM: CT ANGIOGRAPHY CHEST CT ABDOMEN AND PELVIS WITH CONTRAST TECHNIQUE: Multidetector CT imaging of the chest was performed using the standard protocol during bolus administration of intravenous contrast. Multiplanar CT image reconstructions and MIPs were obtained to evaluate the vascular anatomy. Multidetector CT imaging of the  abdomen and pelvis was performed using the standard protocol during bolus administration of intravenous contrast. RADIATION DOSE REDUCTION: This exam was performed according to the departmental dose-optimization program which includes automated exposure control, adjustment of the mA and/or kV according to patient size and/or use of iterative reconstruction technique. CONTRAST:  14m OMNIPAQUE IOHEXOL 350 MG/ML SOLN COMPARISON:  None available FINDINGS: CTA CHEST FINDINGS Cardiovascular: Evidence of pulmonary embolus. Normal heart size. No pericardial effusion. Moderate coronary artery calcifications of the LAD and RCA. Normal caliber thoracic aorta with moderate calcified plaque. Mediastinum/Nodes: Esophagus and thyroid are unremarkable. No pathologically enlarged lymph nodes seen in the chest. Lungs/Pleura: Central airways are patent. Mild centrilobular emphysema. No consolidation, pleural effusion or pneumothorax. Atelectasis of the lingula. Musculoskeletal: No chest wall abnormality. Mildly displaced fractures of the posterolateral right 11th and 10th ribs with evidence of callus formation. Review of the MIP images confirms the above findings. CT ABDOMEN and PELVIS FINDINGS Hepatobiliary: No suspicious liver lesions. Gallstones and gallbladder sludge with a 7 mm stone seen in the gallbladder neck on series 6, image 45. No gallbladder wall thickening. No biliary ductal dilation. Pancreas: Unremarkable. No pancreatic ductal dilatation or surrounding inflammatory changes. Spleen: Normal in size without focal abnormality. Adrenals/Urinary Tract: Bilateral adrenal glands are unremarkable. No hydronephrosis or nephrolithiasis. Tiny bilateral low-attenuation renal lesions which are too small to completely characterize but likely simple cysts, no further follow-up imaging is recommended. Stomach/Bowel: Stomach is within normal limits. Appendix appears normal. No evidence of bowel wall thickening, distention, or  inflammatory changes. Vascular/Lymphatic: Aortic atherosclerosis. No enlarged abdominal or pelvic lymph nodes. Reproductive: Uterus and bilateral adnexa are unremarkable. Other: Small locule of subcutaneous gas  seen in the right lower abdominal wall on series 3, image 70, likely related to subcutaneous injection. Trace abdominal ascites. Musculoskeletal: No acute or significant osseous findings. Review of the MIP images confirms the above findings. IMPRESSION: 1. No evidence of pulmonary embolus or acute airspace opacity. 2. Cholelithiasis with a 7 mm stone seen in the gallbladder neck, although there is no evidence of gallbladder wall thickening. Correlate for symptoms of right upper quadrant pain and consider gallbladder ultrasound for further evaluation. 3. Trace abdominal ascites. 4. Small locule of subcutaneous gas seen in the right lower abdominal wall, likely related to subcutaneous injection. Correlate clinically. 5. Mildly displaced fractures of the posterolateral right 11th and 10th ribs with evidence of callus formation, likely subacute/chronic. 6. Moderate coronary artery calcifications of the LAD and RCA. 7. Aortic Atherosclerosis (ICD10-I70.0) and Emphysema (ICD10-J43.9). Electronically Signed   By: Yetta Glassman M.D.   On: 04/17/2022 15:50   CT HEAD WO CONTRAST (5MM)  Result Date: 04/17/2022 CLINICAL DATA:  Head trauma, minor (Age >= 65y) EXAM: CT HEAD WITHOUT CONTRAST TECHNIQUE: Contiguous axial images were obtained from the base of the skull through the vertex without intravenous contrast. RADIATION DOSE REDUCTION: This exam was performed according to the departmental dose-optimization program which includes automated exposure control, adjustment of the mA and/or kV according to patient size and/or use of iterative reconstruction technique. COMPARISON:  None Available. FINDINGS: Brain: No intracranial hemorrhage, mass effect, or midline shift. Normal brain volume for age. No hydrocephalus.  The basilar cisterns are patent. Mild to moderate periventricular and deep white matter hypodensity typical of chronic small vessel ischemia. No evidence of territorial infarct or acute ischemia. No extra-axial or intracranial fluid collection. Vascular: Atherosclerosis of skullbase vasculature without hyperdense vessel or abnormal calcification. Skull: No fracture or focal lesion. Sinuses/Orbits: Partial opacification of bilateral mastoid air cells. Paranasal sinuses are clear. Bilateral lens extraction. Other: None. IMPRESSION: 1. No acute intracranial abnormality. No skull fracture. 2. Mild to moderate chronic small vessel ischemia. Electronically Signed   By: Keith Rake M.D.   On: 04/17/2022 15:43   CT Cervical Spine Wo Contrast  Result Date: 04/17/2022 CLINICAL DATA:  Neck trauma EXAM: CT CERVICAL SPINE WITHOUT CONTRAST TECHNIQUE: Multidetector CT imaging of the cervical spine was performed without intravenous contrast. Multiplanar CT image reconstructions were also generated. RADIATION DOSE REDUCTION: This exam was performed according to the departmental dose-optimization program which includes automated exposure control, adjustment of the mA and/or kV according to patient size and/or use of iterative reconstruction technique. COMPARISON:  None Available. FINDINGS: Alignment: Straightening of the cervical spine. No subluxation. Facet alignment within normal limits. Skull base and vertebrae: No acute fracture. No primary bone lesion or focal pathologic process. Soft tissues and spinal canal: No prevertebral fluid or swelling. No visible canal hematoma. Disc levels: Mild disc space narrowing C5-C6 with mild foraminal narrowing. Upper chest: Emphysema Other: Mildly enlarged nonspecific lymph node posterior to the left parotid tail measuring 11 mm. IMPRESSION: 1. Straightening of the cervical spine. No acute osseous abnormality. 2. Emphysema Electronically Signed   By: Donavan Foil M.D.   On:  04/17/2022 15:30   DG Hip Unilat W or Wo Pelvis 2-3 Views Right  Result Date: 04/17/2022 CLINICAL DATA:  Fall. EXAM: DG HIP (WITH OR WITHOUT PELVIS) 2-3V RIGHT COMPARISON:  None Available. FINDINGS: There is no evidence of hip fracture or dislocation. There is no evidence of arthropathy or other focal bone abnormality. IMPRESSION: Negative. Electronically Signed   By: Verda Cumins.D.  On: 04/17/2022 14:00   DG Chest 2 View  Result Date: 04/17/2022 CLINICAL DATA:  Shortness of breath. EXAM: CHEST - 2 VIEW COMPARISON:  None Available. FINDINGS: 1342 hours. Lungs are hyperexpanded. The cardio pericardial silhouette is enlarged. Interstitial markings are diffusely coarsened with chronic features. No focal consolidation or pleural effusion. Telemetry leads overlie the chest. IMPRESSION: Emphysema without acute cardiopulmonary findings. Electronically Signed   By: Misty Stanley M.D.   On: 04/17/2022 13:59      Physical Examination: Vitals:   04/17/22 2100 04/17/22 2210 04/17/22 2242 04/18/22 0021  BP: (!) 151/93 (!) 151/78  136/74  Pulse: (!) 56 69    Temp:   99.3 F (37.4 C) 98.5 F (36.9 C)  Resp: 18 15    Height:    '5\' 3"'$  (1.6 m)  Weight:    77.6 kg  SpO2: 100% 98%  95%  TempSrc:   Oral Oral  BMI (Calculated):    30.3   Physical Exam Vitals and nursing note reviewed.  Constitutional:      General: She is not in acute distress.    Appearance: Normal appearance. She is not ill-appearing, toxic-appearing or diaphoretic.  HENT:     Head: Normocephalic and atraumatic.     Right Ear: Hearing and external ear normal.     Left Ear: Hearing and external ear normal.     Nose: Nose normal. No nasal deformity.     Mouth/Throat:     Lips: Pink.     Mouth: Mucous membranes are moist.     Tongue: No lesions.     Pharynx: Oropharynx is clear.  Eyes:     Extraocular Movements: Extraocular movements intact.     Pupils: Pupils are equal, round, and reactive to light.  Neck:      Vascular: No carotid bruit.  Cardiovascular:     Rate and Rhythm: Normal rate and regular rhythm.     Pulses: Normal pulses.     Heart sounds: Normal heart sounds.  Pulmonary:     Effort: Pulmonary effort is normal.     Breath sounds: No wheezing or rales.  Abdominal:     General: Bowel sounds are normal. There is no distension.     Palpations: Abdomen is soft. There is no mass.     Tenderness: There is no abdominal tenderness. There is no guarding.     Hernia: No hernia is present.  Musculoskeletal:     Right lower leg: No edema.     Left lower leg: No edema.  Skin:    General: Skin is warm.  Neurological:     General: No focal deficit present.     Mental Status: She is alert and oriented to person, place, and time.     Cranial Nerves: Cranial nerves 2-12 are intact.     Motor: Motor function is intact.  Psychiatric:        Attention and Perception: Attention normal.        Mood and Affect: Mood normal.        Speech: Speech normal.        Behavior: Behavior normal. Behavior is cooperative.        Cognition and Memory: Cognition normal.        Assessment and Plan: * Falls Fall precautions. We will obtain an MRI of the brain to make sure patient did not have a TIA or CVA.  D-dimer negative for VTE evalve.   Essential hypertension Vitals:   04/17/22 1300 04/17/22  1403 04/17/22 1430 04/17/22 1545  BP: 112/60 128/69 125/79 (!) 134/58   04/17/22 1630 04/17/22 1700 04/17/22 1800 04/17/22 1900  BP: (!) 101/49 (!) 153/84 (!) 149/91 (!) 147/87   04/17/22 2007 04/17/22 2100 04/17/22 2210 04/18/22 0021  BP: (!) 153/81 (!) 151/93 (!) 151/78 136/74  Will follow patient's orthostatic vitals and then resume amlodipine Lasix and resume metoprolol at regular dose.  Currently cut down at 12.5 mg daily.   Elevated serum free T4 level Mild elevation in free T4. Discussed with family that patient will probably need to have a repeat follow-up and possibly a thyroid ultrasound upon   follow-up appointment  Sinus tachycardia by electrocardiogram    Sinus tachycardia rhythm.  Tobacco abuse Counseled patient for tobacco cessation and nicotine patch.   Transient left leg weakness Patient states that she fell today and felt like her left leg gave gave out on her. We have ordered an MRI of the brain. We will follow. Patient on Eliquis therapy currently. We will encourage for statin therapy.  Recurrent major depressive disorder, in partial remission ([place]) Patient continued on Elavil.   Panlobular emphysema ([place]) Stable and compensated currently we will continue patient on her Flonase as needed and NicoDerm patch. Albuterol MDI as needed.   Hyperlipidemia, mixed A.m. lipid panel continue patient on Crestor.   Glaucoma Patient states that she has not been using any eyedrops. Discussed with her that she needs to follow-up with ophthalmology for routine monitoring of her glaucoma and to refrain from using Flonase up until she had an eye evaluation for her glaucoma.  GERD (gastroesophageal reflux disease) Patient continued on IV PPI regimen.    DVT prophylaxis:  Heparin x single dose 5000 subcu resume in am.    Code Status:  Full Code    Disposition Plan:  Home    Consults called:  None   Admission status: Observation    Unit/ Expected LOS: Med tele progressive.   Para Skeans MD [facility]  6 PM- 2 AM. Please contact me via secure Chat 6 PM-2 AM. [phone] ( Pager ) To contact the [facility] Attending or Consulting provider [place] or covering provider during after hours [place], for this patient.   Check the care team in [facility] and look for a) attending/consulting [facility] provider listed and b) the [facility] team listed Log into [email or web address] and use [place] universal password to access. If you do not have the password, please contact the hospital operator. Locate the [facility] provider you are looking for under [facility] and page to a  number that you can be directly reached. If you still have difficulty reaching the provider, please page the [facility] (Director on Call) for the Hospitalists listed on amion for assistance. [email or web address] 04/18/2022, 3:50 AM

## 2022-04-17 NOTE — ED Notes (Addendum)
Pt hit call bell and told secretary she was done having BM. This RN called for EDT to assist in rolling and cleaning pt. Pt call bell ringing for approximately 1-71mns while this RN waiting for EDT and family member pushed staff assist button and was seen moving away from wall panel once this RN entered room to see if staff member needed assistance. Family advised those buttons are for staff members only. Charge RN made aware family member pushed staff assist button and not staff member.

## 2022-04-17 NOTE — ED Triage Notes (Addendum)
Pt to ED via [facility] from home. Pt had cyst removal from spine on Wednesday. Pt had a fall today. Pt reports since has not been able to ambulate and unable to void. Pt HR ranged between 60-190s showing afib. No hx afib. Pt given fluid in route.   Pt with hx COPD and uses oxygen at home PRN. EMS states oxygen normally 90-92%.  EMS VS: CBG 146 BP 110/72 HR 60-190s 90% RA

## 2022-04-18 ENCOUNTER — Observation Stay
Admit: 2022-04-18 | Discharge: 2022-04-18 | Disposition: A | Discharge status: Home / Self Care | Payer: Medicare HMO | Attending: Internal Medicine | Admitting: Internal Medicine

## 2022-04-18 DIAGNOSIS — G9341 Metabolic encephalopathy: Secondary | ICD-10-CM | POA: Diagnosis not present

## 2022-04-18 DIAGNOSIS — E876 Hypokalemia: Secondary | ICD-10-CM | POA: Diagnosis not present

## 2022-04-18 DIAGNOSIS — J431 Panlobular emphysema: Secondary | ICD-10-CM | POA: Diagnosis not present

## 2022-04-18 DIAGNOSIS — F3341 Major depressive disorder, recurrent, in partial remission: Secondary | ICD-10-CM

## 2022-04-18 DIAGNOSIS — W19XXXS Unspecified fall, sequela: Secondary | ICD-10-CM | POA: Diagnosis not present

## 2022-04-18 DIAGNOSIS — Z720 Tobacco use: Secondary | ICD-10-CM

## 2022-04-18 DIAGNOSIS — M7138 Other bursal cyst, other site: Secondary | ICD-10-CM

## 2022-04-18 DIAGNOSIS — E782 Mixed hyperlipidemia: Secondary | ICD-10-CM

## 2022-04-18 LAB — COMPREHENSIVE METABOLIC PANEL
ALT: 10 U/L (ref 0–44)
AST: 18 U/L (ref 15–41)
Albumin: 2.9 g/dL — ABNORMAL LOW (ref 3.5–5.0)
Alkaline Phosphatase: 103 U/L (ref 38–126)
Anion gap: 10 (ref 5–15)
BUN: 7 mg/dL — ABNORMAL LOW (ref 8–23)
CO2: 21 mmol/L — ABNORMAL LOW (ref 22–32)
Calcium: 7.5 mg/dL — ABNORMAL LOW (ref 8.9–10.3)
Chloride: 104 mmol/L (ref 98–111)
Creatinine, Ser: 0.5 mg/dL (ref 0.44–1.00)
GFR, Estimated: >60 mL/min (ref >60)
Glucose, Bld: 105 mg/dL — ABNORMAL HIGH (ref 70–99)
Potassium: 2.6 mmol/L — CL (ref 3.5–5.1)
Sodium: 135 mmol/L (ref 135–145)
Total Bilirubin: 0.9 mg/dL (ref 0.3–1.2)
Total Protein: 5.6 g/dL — ABNORMAL LOW (ref 6.5–8.1)

## 2022-04-18 LAB — CBC
HCT: 41.5 % (ref 36.0–46.0)
Hemoglobin: 13.5 g/dL (ref 12.0–15.0)
MCH: 28.7 pg (ref 26.0–34.0)
MCHC: 32.5 g/dL (ref 30.0–36.0)
MCV: 88.3 fL (ref 80.0–100.0)
Platelets: 144 10*3/uL — ABNORMAL LOW (ref 150–400)
RBC: 4.7 MIL/uL (ref 3.87–5.11)
RDW: 12.9 % (ref 11.5–15.5)
WBC: 9.1 10*3/uL (ref 4.0–10.5)
nRBC: 0 % (ref 0.0–0.2)

## 2022-04-18 LAB — MAGNESIUM: Magnesium: 2.2 mg/dL (ref 1.7–2.4)

## 2022-04-18 LAB — HEMOGLOBIN A1C
Hgb A1c MFr Bld: 5.4 % (ref 4.8–5.6)
Mean Plasma Glucose: 108.28 mg/dL

## 2022-04-18 LAB — POTASSIUM: Potassium: 3.9 mmol/L (ref 3.5–5.1)

## 2022-04-18 LAB — TROPONIN I (HIGH SENSITIVITY): Troponin I (High Sensitivity): 33 ng/L — ABNORMAL HIGH (ref <18)

## 2022-04-18 MED ORDER — POTASSIUM CHLORIDE CRYS ER 20 MEQ PO TBCR
40.0000 meq | EXTENDED_RELEASE_TABLET | Freq: Once | ORAL | Status: AC
  Administered 2022-04-18: 40 meq via ORAL
  Filled 2022-04-18: qty 2

## 2022-04-18 MED ORDER — POTASSIUM CHLORIDE 10 MEQ/100ML IV SOLN
10.0000 meq | INTRAVENOUS | Status: DC
  Administered 2022-04-18 (×2): 10 meq via INTRAVENOUS
  Filled 2022-04-18: qty 100

## 2022-04-18 MED ORDER — ROSUVASTATIN CALCIUM 5 MG PO TABS
5.0000 mg | ORAL_TABLET | Freq: Every day | ORAL | Status: DC
  Administered 2022-04-18 – 2022-04-22 (×5): 5 mg via ORAL
  Filled 2022-04-18 (×5): qty 1

## 2022-04-18 MED ORDER — POTASSIUM CHLORIDE IN NACL 20-0.9 MEQ/L-% IV SOLN
INTRAVENOUS | Status: DC
  Filled 2022-04-18 (×2): qty 1000

## 2022-04-18 MED ORDER — POTASSIUM CHLORIDE 10 MEQ/100ML IV SOLN: 10.0000 meq | INTRAVENOUS | Status: DC

## 2022-04-18 MED ORDER — POTASSIUM CHLORIDE 10 MEQ/100ML IV SOLN
10.0000 meq | INTRAVENOUS | Status: DC
  Administered 2022-04-18: 10 meq via INTRAVENOUS
  Filled 2022-04-18: qty 100

## 2022-04-18 MED ORDER — POTASSIUM CHLORIDE CRYS ER 20 MEQ PO TBCR
40.0000 meq | EXTENDED_RELEASE_TABLET | Freq: Every day | ORAL | Status: DC
  Administered 2022-04-18 – 2022-04-19 (×2): 40 meq via ORAL
  Filled 2022-04-18 (×2): qty 2

## 2022-04-18 MED ORDER — METOPROLOL SUCCINATE ER 25 MG PO TB24
12.5000 mg | ORAL_TABLET | Freq: Every day | ORAL | Status: DC
  Administered 2022-04-18: 12.5 mg via ORAL
  Filled 2022-04-18: qty 1

## 2022-04-18 MED ORDER — SODIUM CHLORIDE 0.9 % IV SOLN: INTRAVENOUS | Status: DC | PRN

## 2022-04-18 NOTE — Evaluation (Signed)
Physical Therapy Evaluation Patient Details Name: Kristen Potts MRN: [id number] DOB: Aug 31, 1945 Today's Date: 04/18/2022  History of Present Illness  76yo female s/p L4-5 lumbar decompression including central laminectomy and bilateral medial facetectomies including foraminotomies on 04/15/22, cyst removal. Pt now returns having a fall at home.PMhx includes anxiety, arthritis, COPD, depression. GERD, glaucoma, HLD, HTN.  Clinical Impression  Pt struggled t/o session despite doing relatively well just a few days ago post surgery.  She needed considerable assist to get to EOB and to attain standing.  She nearly sat on the floor twice while trying to sit (commode, recliner) while at least 45* from square to the surface showing no awareness when directly cued that she was not at all where she needed to be to sit.  She have very asymmetric gait with R LE trailing, heavy walker reliance and halting/shuffling "cadence."  Pt is apparently home alone at night, but does have sister that can/does sit with her t/o much of the day but ultimately she showed very poor safety awareness and mobility efficiency - of note pt was able to ambulate ~200 ft (albeit slowly) just a few days ago post-op.  Per today's effort with PT the only safe d/c plan would be short term rehab.     Recommendations for follow up therapy are one component of a multi-disciplinary discharge planning process, led by the attending physician.  Recommendations may be updated based on patient status, additional functional criteria and insurance authorization.  Follow Up Recommendations Skilled nursing-short term rehab (<3 hours/day) Can patient physically be transported by private vehicle: No    Assistance Recommended at Discharge Frequent or constant Supervision/Assistance  Patient can return home with the following  A lot of help with walking and/or transfers;A lot of help with bathing/dressing/bathroom;Assistance with cooking/housework;Assist  for transportation;Help with stairs or ramp for entrance    Equipment Recommendations None recommended by PT  Recommendations for Other Services       Functional Status Assessment Patient has had a recent decline in their functional status and demonstrates the ability to make significant improvements in function in a reasonable and predictable amount of time.     Precautions / Restrictions Precautions Precautions: Fall Restrictions Weight Bearing Restrictions: No      Mobility  Bed Mobility Overal bed mobility: Needs Assistance Bed Mobility: Rolling, Sidelying to Sit Rolling: Min assist Sidelying to sit: Mod assist       General bed mobility comments: Pt with good effort, but unable to get to EOB on her own, or even with min assist and cuing/encourgement    Transfers Overall transfer level: Needs assistance Equipment used: Rolling walker (2 wheels) Transfers: Sit to/from Stand Sit to Stand: Min assist, Mod assist           General transfer comment: Pt struggled to stand from standard height bed, needing direct assist; similarly needing some assist with getting to standing from low commode despite heavy use of rail.  Pt with very poor awareness in getting to sitting - on both the commode and return to recliner she started to sit far before her hips were square to the surface and even with cuing attempted to sit very pre-maturely needing direct assist on both attempt to insure she did not land on the floor.    Ambulation/Gait Ambulation/Gait assistance: Min assist, Mod assist Gait Distance (Feet): 15 Feet ((15 ft to bathroom, 15 ft back) Assistive device: Rolling walker (2 wheels)         General Gait Details:  Pt with very poor awareness t/o the effort.  RW wheel hitting unmoving obstacles multiple times - unable to make adjustments despite cuing.  R LE trailing with very halting/shuffling gait again with inability to make adjustments despite cuing.  No overt LOBs but  slow, uncoordinated, unsafe and given that she walked nearly 200 pt with PT 2 days ago post-op far from expected  Stairs            Wheelchair Mobility    Modified Rankin (Stroke Patients Only)       Balance Overall balance assessment: Needs assistance   Sitting balance-Leahy Scale: Fair     Standing balance support: Bilateral upper extremity supported Standing balance-Leahy Scale: Poor Standing balance comment: poor awareness, forward flexed with reliance on walker despite poor positional awareness with it                             Pertinent Vitals/Pain Pain Assessment Pain Assessment: Faces Faces Pain Scale: Hurts little more Pain Location: back    Home Living Family/patient expects to be discharged to:: Unsure (pt wanting to go home, is not safe to do so) Living Arrangements: Alone Available Help at Discharge:  (sister, son) Type of Home: House Home Access: Ramped entrance       Home Layout: One level Home Equipment: Conservation officer, nature (2 wheels);Cane - single point;Shower seat Additional Comments: has had numerous falls in the last 6 months including a fall leading to this admission    Prior Function Prior Level of Function : Independent/Modified Independent;Driving;History of Falls (last six months);Needs assist             Mobility Comments: apparently had been able to get out into the community, etc until last year now more limited, does not drive, multiple falls ADLs Comments: modI with ADL, sister assists with groceries, driving recently ,and take out meals. Pt doesn't really cook for herself.     Hand Dominance        Extremity/Trunk Assessment   Upper Extremity Assessment Upper Extremity Assessment: Generalized weakness    Lower Extremity Assessment Lower Extremity Assessment: Generalized weakness (L LE grossly 3/5, R 4/5)       Communication   Communication: No difficulties  Cognition Arousal/Alertness:  Awake/alert Behavior During Therapy: Flat affect Overall Cognitive Status: Within Functional Limits for tasks assessed                                 General Comments: decreased awareness, at times slow to process, PRrepeated safety/sequencing cues with limited carry-over        General Comments General comments (skin integrity, edema, etc.): Pt felt she needed to have a BM, sat on commode w/o success    Exercises     Assessment/Plan    PT Assessment Patient needs continued PT services  PT Problem List Decreased strength;Decreased mobility;Decreased activity tolerance;Decreased balance;Pain;Decreased knowledge of use of DME;Decreased knowledge of precautions;Decreased range of motion;Decreased cognition;Decreased coordination;Decreased safety awareness       PT Treatment Interventions DME instruction;Therapeutic exercise;Gait training;Balance training;Neuromuscular re-education;Functional mobility training;Cognitive remediation;Therapeutic activities;Patient/family education    PT Goals (Current goals can be found in the Care Plan section)  Acute Rehab PT Goals Patient Stated Goal: to go home PT Goal Formulation: With patient Time For Goal Achievement: 05/01/22 Potential to Achieve Goals: Fair    Frequency Min 2X/week     Co-evaluation  AM-PAC PT "6 Clicks" Mobility  Outcome Measure Help needed turning from your back to your side while in a flat bed without using bedrails?: A Little Help needed moving from lying on your back to sitting on the side of a flat bed without using bedrails?: A Lot Help needed moving to and from a bed to a chair (including a wheelchair)?: A Lot Help needed standing up from a chair using your arms (e.g., wheelchair or bedside chair)?: A Lot Help needed to walk in hospital room?: A Lot Help needed climbing 3-5 steps with a railing? : Total 6 Click Score: 12    End of Session Equipment Utilized During Treatment:  Gait belt Activity Tolerance: Other (comment) (poor safety awareness, poor tolerance) Patient left: with chair alarm set;with call bell/phone within reach Nurse Communication: Mobility status PT Visit Diagnosis: Other abnormalities of gait and mobility (R26.89);Difficulty in walking, not elsewhere classified (R26.2);Muscle weakness (generalized) (M62.81);Pain Pain - part of body:  (lumbago)    Time: 4166-0630 PT Time Calculation (min) (ACUTE ONLY): 31 min   Charges:   PT Evaluation $PT Eval Low Complexity: 1 Low PT Treatments $Therapeutic Activity: 8-22 mins        Kreg Shropshire, DPT 04/18/2022, 5:26 PM

## 2022-04-18 NOTE — Progress Notes (Signed)
*  PRELIMINARY RESULTS* Echocardiogram 2D Echocardiogram has been performed.  Kristen Potts 04/18/2022, 2:32 PM

## 2022-04-18 NOTE — Assessment & Plan Note (Deleted)
Patient continued on IV PPI regimen.

## 2022-04-18 NOTE — Assessment & Plan Note (Signed)
MRI of the brain negative for stroke.  Mental status better.  Holding medications Cause altered mental status.  Tylenol for pain.

## 2022-04-18 NOTE — Assessment & Plan Note (Signed)
Status post removal by Dr. Izora Ribas.  I had Dr. Lacinda Axon evaluate.  He cleared for physical therapy to see.

## 2022-04-18 NOTE — Progress Notes (Signed)
  Progress Note   Patient: Kristen Potts EVO:[id number] DOB: Jan 24, 1946 DOA: 04/17/2022     0 DOS: the patient was seen and examined on 04/18/2022     Assessment and Plan: * Acute metabolic encephalopathy MRI of the brain negative for stroke.  Mental status better.  Holding medications Cause altered mental status.  Tylenol for pain.  Hypokalemia Patient unable to tolerate IV rounds of potassium.  Aggressive oral replacement.  Repeat potassium in normal range.  Gentle IV fluids with potassium in it.  Falls Physical therapy evaluation.  Appreciate neurosurgery consultation clearing patient to work with physical therapy.   Essential hypertension Low-dose Toprol-XL at night.  Elevated serum free T4 level Mild elevation in free T4.  TSH normal range.  Follow-up TFTs as outpatient.  Tobacco abuse Nicotine patch.   Synovial cyst of lumbar facet joint Status post removal by Dr. Izora Ribas.  I had Dr. Lacinda Axon evaluate.  He cleared for physical therapy to see.  Recurrent major depressive disorder, in partial remission ([facility]) Continue Elavil.   Panlobular emphysema ([facility]) Respiratory status stable   Hyperlipidemia, mixed Crestor         Subjective: Patient brought in with altered mental status and a fall.  Recently had a back surgery.  She was seen this morning sitting in the chair.  Patient's son states that there was something wrong with her.  Physical Exam: Vitals:   04/17/22 2242 04/18/22 0021 04/18/22 0758 04/18/22 1529  BP:  136/74 (!) 124/55 (!) 143/53  Pulse:   63 (!) 113  Resp:   17 16  Temp: 99.3 F (37.4 C) 98.5 F (36.9 C) 97.7 F (36.5 C) 99.1 F (37.3 C)  TempSrc: Oral Oral Oral   SpO2:  95% 94% 94%  Weight:  77.6 kg    Height:  '5\' 3"'$  (1.6 m)     Physical Exam HENT:     Head: Normocephalic.     Mouth/Throat:     Pharynx: No oropharyngeal exudate.  Eyes:     General: Lids are normal.     Conjunctiva/sclera: Conjunctivae normal.  Cardiovascular:      Rate and Rhythm: Normal rate and regular rhythm.     Heart sounds: Normal heart sounds, S1 normal and S2 normal.  Pulmonary:     Breath sounds: No decreased breath sounds, wheezing, rhonchi or rales.  Abdominal:     Palpations: Abdomen is soft.     Tenderness: There is no abdominal tenderness.  Musculoskeletal:     Right lower leg: Swelling present.     Left lower leg: Swelling present.  Skin:    General: Skin is warm.     Comments: Bruising lower extremities  Neurological:     Mental Status: She is alert.     Comments: Answers questions appropriately.  Unable to straight leg raise.  Able to flex and extend at the ankle.     Data Reviewed: This morning's potassium 2.6, repeat potassium 3.9, white blood cell count 9.1, hemoglobin 13.5, platelet count 144.  Urinalysis negative.  Family Communication: Spoke with son at bedside  Disposition: Status is: Observation The patient remains OBS appropriate and will d/c before 2 midnights.  Planned Discharge Destination: Home with Home Health    Time spent: 28 minutes  Author: Loletha Grayer, MD 04/18/2022 4:33 PM  For on call review [email or web address].

## 2022-04-18 NOTE — Plan of Care (Signed)

## 2022-04-18 NOTE — Assessment & Plan Note (Deleted)
Patient states that she has not been using any eyedrops. Discussed with her that she needs to follow-up with ophthalmology for routine monitoring of her glaucoma and to refrain from using Flonase up until she had an eye evaluation for her glaucoma.

## 2022-04-18 NOTE — Assessment & Plan Note (Signed)
Low-dose Toprol-XL at night.

## 2022-04-18 NOTE — Assessment & Plan Note (Deleted)
    Sinus tachycardia rhythm.

## 2022-04-18 NOTE — Assessment & Plan Note (Addendum)
-   Crestor 

## 2022-04-18 NOTE — Progress Notes (Signed)
Progress Note   Date: 04/18/2022  Ms Landgren is POD#3 from her L4/5 synovial cyst resection  Subjective: She was admitted from ED. States she feels fatigued. She did have a bowel movement, foley catheter was placed for lack of urination and urine is dark. She has not taken a lot of oral intake. She denies any numbness, is having left leg weakness, which was also present pre-operatively. She is having some back and buttocks pain from her fall at home.   Vital Signs: Temp:  [97.7 F (36.5 C)-99.8 F (37.7 C)] 97.7 F (36.5 C) (10/14 0758) Pulse Rate:  [55-120] 63 (10/14 0758) Resp:  [13-20] 17 (10/14 0758) BP: (101-153)/(49-93) 124/55 (10/14 0758) SpO2:  [88 %-100 %] 94 % (10/14 0758) Weight:  [77.6 kg] 77.6 kg (10/14 0021) Temp (24hrs), Avg:98.9 F (37.2 C), Min:97.7 F (36.5 C), Max:99.8 F (37.7 C)  Weight: 77.6 kg   Problem List Patient Active Problem List   Diagnosis Date Noted   Falls 04/17/2022   Constipation 04/17/2022   Tobacco abuse 04/17/2022   Sinus tachycardia by electrocardiogram 04/17/2022   Abnormal EKG 04/17/2022   Elevated glucose 04/17/2022   Elevated serum free T4 level 04/17/2022   Essential hypertension 04/17/2022   Neurogenic claudication due to lumbar spinal stenosis 04/15/2022   Synovial cyst of lumbar facet joint 04/15/2022   Transient left leg weakness 04/15/2022   Neurogenic claudication 04/15/2022   GERD (gastroesophageal reflux disease) 10/05/2017   Glaucoma 10/05/2017   Osteoarthritis 10/05/2017   Tubular adenoma 09/01/2017   Recurrent major depressive disorder, in partial remission ([place]) 02/22/2017   Medicare annual wellness visit, initial 10/26/2016   Low serum vitamin D 06/30/2016   Panlobular emphysema ([place]) 06/30/2016   Hyperlipidemia, mixed 11/25/2015   Adult idiopathic generalized osteoporosis 07/17/2014    Medications: Scheduled Meds:  amitriptyline  50 mg Oral QHS   fluticasone  2 spray Each Nare Daily   metoprolol  tartrate  12.5 mg Oral Once   nicotine  21 mg Transdermal Daily   pantoprazole (PROTONIX) IV  40 mg Intravenous Q12H   potassium chloride  40 mEq Oral Daily   Continuous Infusions:  sodium chloride 10 mL/hr at 04/18/22 0531   0.9 % NaCl with KCl 20 mEq / L 50 mL/hr at 04/18/22 1330   ondansetron (ZOFRAN) IV     PRN Meds:.sodium chloride, acetaminophen **OR** acetaminophen, ondansetron (ZOFRAN) IV  Labs:  Lab Results  Component Value Date   WBC 9.1 04/18/2022   WBC 8.4 04/17/2022   HCT 41.5 04/18/2022   HCT 41.8 04/17/2022   PLT 144 (L) 04/18/2022   PLT 154 04/17/2022   No results found for: "LABPT", "INR", "APTT"  Lab Results  Component Value Date   NA 135 04/18/2022   NA 137 04/17/2022   K 3.9 04/18/2022   K 2.6 (LL) 04/18/2022   BUN 7 (L) 04/18/2022   BUN 16 04/17/2022    Lab Results  Component Value Date   MG 2.2 04/18/2022   Exam:  Sitting upright in chair Dressing cleana nd ry on lumbar spine 4+/5 in right hip flexion, 5/5 in remainder of right leg 3/5 left hip flexion, 4/5 in knee extension, 5/5 in dorsi/plantar flexion Sensation intact throughout  Imaging: CT A/P: Lumbar spine laminectomy defect noted, no obvious fluid collection    A/P: Ms Feliz is overall stable neurologically. She is fatigued and has poor intake complicating her post op course. Would recommend nutrition and PT.  - Will continue to see  as inpatient  Deetta Perla, MD

## 2022-04-18 NOTE — Assessment & Plan Note (Addendum)
Mild elevation in free T4.  TSH normal range.  Follow-up TFTs as outpatient.

## 2022-04-18 NOTE — Assessment & Plan Note (Addendum)
Respiratory status stable. 

## 2022-04-18 NOTE — Assessment & Plan Note (Signed)
Replaced into the normal range today.  Stop IV potassium supplementation.

## 2022-04-18 NOTE — Progress Notes (Signed)
Patient could not tolerate IV KCL, reporting burning and aching. Contacted the pharmacy to decrease the rate to 40m/hr with NS. Pain did not resolve. Notified on-call provider. IV KCL was discontinued and an oral K supplement was ordered.

## 2022-04-18 NOTE — Assessment & Plan Note (Addendum)
Physical therapy recommending rehab 

## 2022-04-18 NOTE — Assessment & Plan Note (Addendum)
-  Nicotine patch 

## 2022-04-18 NOTE — Assessment & Plan Note (Addendum)
Continue Elavil.

## 2022-04-18 NOTE — Assessment & Plan Note (Deleted)
Vitals:   04/17/22 1300 04/17/22 1403 04/17/22 1430 04/17/22 1545  BP: 112/60 128/69 125/79 (!) 134/58   04/17/22 1630 04/17/22 1700 04/17/22 1800 04/17/22 1900  BP: (!) 101/49 (!) 153/84 (!) 149/91 (!) 147/87   04/17/22 2007 04/17/22 2100 04/17/22 2210 04/18/22 0021  BP: (!) 153/81 (!) 151/93 (!) 151/78 136/74  Will follow patient's orthostatic vitals and then resume amlodipine Lasix and resume metoprolol at regular dose.  Currently cut down at 12.5 mg daily.

## 2022-04-18 NOTE — Assessment & Plan Note (Deleted)
Patient states that she fell today and felt like her left leg gave gave out on her. We have ordered an MRI of the brain. We will follow. Patient on Eliquis therapy currently. We will encourage for statin therapy.

## 2022-04-19 ENCOUNTER — Observation Stay: Payer: Medicare HMO

## 2022-04-19 DIAGNOSIS — R197 Diarrhea, unspecified: Secondary | ICD-10-CM

## 2022-04-19 DIAGNOSIS — G9341 Metabolic encephalopathy: Secondary | ICD-10-CM | POA: Diagnosis present

## 2022-04-19 DIAGNOSIS — E669 Obesity, unspecified: Secondary | ICD-10-CM | POA: Diagnosis present

## 2022-04-19 DIAGNOSIS — J431 Panlobular emphysema: Secondary | ICD-10-CM | POA: Diagnosis present

## 2022-04-19 DIAGNOSIS — Y92009 Unspecified place in unspecified non-institutional (private) residence as the place of occurrence of the external cause: Secondary | ICD-10-CM | POA: Diagnosis not present

## 2022-04-19 DIAGNOSIS — R509 Fever, unspecified: Secondary | ICD-10-CM | POA: Diagnosis present

## 2022-04-19 DIAGNOSIS — K8020 Calculus of gallbladder without cholecystitis without obstruction: Secondary | ICD-10-CM | POA: Diagnosis present

## 2022-04-19 DIAGNOSIS — I480 Paroxysmal atrial fibrillation: Secondary | ICD-10-CM

## 2022-04-19 DIAGNOSIS — B370 Candidal stomatitis: Secondary | ICD-10-CM

## 2022-04-19 DIAGNOSIS — F17210 Nicotine dependence, cigarettes, uncomplicated: Secondary | ICD-10-CM | POA: Diagnosis present

## 2022-04-19 DIAGNOSIS — H409 Unspecified glaucoma: Secondary | ICD-10-CM | POA: Diagnosis present

## 2022-04-19 DIAGNOSIS — K219 Gastro-esophageal reflux disease without esophagitis: Secondary | ICD-10-CM | POA: Diagnosis present

## 2022-04-19 DIAGNOSIS — F419 Anxiety disorder, unspecified: Secondary | ICD-10-CM | POA: Diagnosis present

## 2022-04-19 DIAGNOSIS — W010XXA Fall on same level from slipping, tripping and stumbling without subsequent striking against object, initial encounter: Secondary | ICD-10-CM | POA: Diagnosis present

## 2022-04-19 DIAGNOSIS — Z6830 Body mass index (BMI) 30.0-30.9, adult: Secondary | ICD-10-CM | POA: Diagnosis not present

## 2022-04-19 DIAGNOSIS — I10 Essential (primary) hypertension: Secondary | ICD-10-CM | POA: Diagnosis present

## 2022-04-19 DIAGNOSIS — Z1152 Encounter for screening for COVID-19: Secondary | ICD-10-CM | POA: Diagnosis not present

## 2022-04-19 DIAGNOSIS — E876 Hypokalemia: Secondary | ICD-10-CM | POA: Diagnosis present

## 2022-04-19 DIAGNOSIS — E782 Mixed hyperlipidemia: Secondary | ICD-10-CM | POA: Diagnosis present

## 2022-04-19 DIAGNOSIS — M549 Dorsalgia, unspecified: Secondary | ICD-10-CM | POA: Diagnosis present

## 2022-04-19 DIAGNOSIS — A0472 Enterocolitis due to Clostridium difficile, not specified as recurrent: Secondary | ICD-10-CM | POA: Diagnosis present

## 2022-04-19 DIAGNOSIS — R339 Retention of urine, unspecified: Secondary | ICD-10-CM | POA: Diagnosis present

## 2022-04-19 DIAGNOSIS — E860 Dehydration: Secondary | ICD-10-CM | POA: Diagnosis present

## 2022-04-19 DIAGNOSIS — B379 Candidiasis, unspecified: Secondary | ICD-10-CM | POA: Diagnosis present

## 2022-04-19 DIAGNOSIS — K649 Unspecified hemorrhoids: Secondary | ICD-10-CM | POA: Diagnosis present

## 2022-04-19 DIAGNOSIS — F3341 Major depressive disorder, recurrent, in partial remission: Secondary | ICD-10-CM | POA: Diagnosis present

## 2022-04-19 DIAGNOSIS — M170 Bilateral primary osteoarthritis of knee: Secondary | ICD-10-CM | POA: Diagnosis present

## 2022-04-19 LAB — BASIC METABOLIC PANEL
Anion gap: 7 (ref 5–15)
BUN: 8 mg/dL (ref 8–23)
CO2: 23 mmol/L (ref 22–32)
Calcium: 7.7 mg/dL — ABNORMAL LOW (ref 8.9–10.3)
Chloride: 106 mmol/L (ref 98–111)
Creatinine, Ser: 0.61 mg/dL (ref 0.44–1.00)
GFR, Estimated: >60 mL/min (ref >60)
Glucose, Bld: 109 mg/dL — ABNORMAL HIGH (ref 70–99)
Potassium: 3 mmol/L — ABNORMAL LOW (ref 3.5–5.1)
Sodium: 136 mmol/L (ref 135–145)

## 2022-04-19 LAB — ECHOCARDIOGRAM COMPLETE
AR max vel: 1.99 cm2
AV Peak grad: 9 mmHg
Ao pk vel: 1.5 m/s
Area-P 1/2: 5.01 cm2
Calc EF: 55.1 %
Height: 63 in
S' Lateral: 3.2 cm
Single Plane A2C EF: 60 %
Single Plane A4C EF: 51.9 %
Weight: 2736 oz

## 2022-04-19 LAB — C DIFFICILE QUICK SCREEN W PCR REFLEX
C Diff antigen: POSITIVE — AB
C Diff interpretation: DETECTED
C Diff toxin: POSITIVE — AB

## 2022-04-19 LAB — PHOSPHORUS: Phosphorus: 1.5 mg/dL — ABNORMAL LOW (ref 2.5–4.6)

## 2022-04-19 LAB — MAGNESIUM: Magnesium: 1.9 mg/dL (ref 1.7–2.4)

## 2022-04-19 MED ORDER — AZITHROMYCIN 500 MG PO TABS
500.0000 mg | ORAL_TABLET | Freq: Every day | ORAL | Status: AC
  Administered 2022-04-19: 500 mg via ORAL
  Filled 2022-04-19: qty 1

## 2022-04-19 MED ORDER — FLUCONAZOLE 100 MG PO TABS
100.0000 mg | ORAL_TABLET | Freq: Every day | ORAL | Status: DC
  Administered 2022-04-20 – 2022-04-23 (×4): 100 mg via ORAL
  Filled 2022-04-19 (×4): qty 1

## 2022-04-19 MED ORDER — SODIUM CHLORIDE 0.9 % IV SOLN
2.0000 g | INTRAVENOUS | Status: DC
  Administered 2022-04-19: 2 g via INTRAVENOUS
  Filled 2022-04-19: qty 20

## 2022-04-19 MED ORDER — FLUCONAZOLE IN SODIUM CHLORIDE 200-0.9 MG/100ML-% IV SOLN
200.0000 mg | Freq: Once | INTRAVENOUS | Status: AC
  Administered 2022-04-19: 200 mg via INTRAVENOUS
  Filled 2022-04-19: qty 100

## 2022-04-19 MED ORDER — FLUCONAZOLE 100MG IVPB: 100.0000 mg | INTRAVENOUS | Status: DC

## 2022-04-19 MED ORDER — VANCOMYCIN HCL 125 MG PO CAPS: 125.0000 mg | ORAL_CAPSULE | Freq: Four times a day (QID) | ORAL | Status: DC

## 2022-04-19 MED ORDER — VANCOMYCIN HCL 125 MG PO CAPS
125.0000 mg | ORAL_CAPSULE | Freq: Four times a day (QID) | ORAL | Status: DC
  Administered 2022-04-19 – 2022-04-23 (×15): 125 mg via ORAL
  Filled 2022-04-19 (×17): qty 1

## 2022-04-19 MED ORDER — ENOXAPARIN SODIUM 40 MG/0.4ML IJ SOSY
40.0000 mg | PREFILLED_SYRINGE | INTRAMUSCULAR | Status: DC
  Administered 2022-04-19 – 2022-04-22 (×4): 40 mg via SUBCUTANEOUS
  Filled 2022-04-19 (×4): qty 0.4

## 2022-04-19 MED ORDER — MAGNESIUM SULFATE 2 GM/50ML IV SOLN
2.0000 g | Freq: Once | INTRAVENOUS | Status: AC
  Administered 2022-04-19: 2 g via INTRAVENOUS
  Filled 2022-04-19: qty 50

## 2022-04-19 MED ORDER — SODIUM CHLORIDE 0.9 % IV SOLN
2.0000 g | INTRAVENOUS | Status: DC
  Filled 2022-04-19: qty 20

## 2022-04-19 MED ORDER — SODIUM CHLORIDE 0.9% FLUSH
10.0000 mL | INTRAVENOUS | Status: DC | PRN
  Administered 2022-04-20: 10 mL

## 2022-04-19 MED ORDER — SODIUM CHLORIDE 0.9 % IV SOLN: INTRAVENOUS | Status: DC

## 2022-04-19 MED ORDER — POTASSIUM PHOSPHATES 15 MMOLE/5ML IV SOLN
30.0000 mmol | Freq: Once | INTRAVENOUS | Status: DC
  Filled 2022-04-19: qty 10

## 2022-04-19 MED ORDER — SODIUM CHLORIDE 0.9% FLUSH
10.0000 mL | Freq: Two times a day (BID) | INTRAVENOUS | Status: DC
  Administered 2022-04-19 – 2022-04-21 (×6): 10 mL
  Administered 2022-04-22: 20 mL
  Administered 2022-04-22 – 2022-04-23 (×2): 10 mL

## 2022-04-19 MED ORDER — METOPROLOL SUCCINATE ER 25 MG PO TB24
25.0000 mg | ORAL_TABLET | Freq: Every day | ORAL | Status: DC
  Administered 2022-04-19 – 2022-04-23 (×5): 25 mg via ORAL
  Filled 2022-04-19 (×5): qty 1

## 2022-04-19 MED ORDER — AZITHROMYCIN 250 MG PO TABS: 250.0000 mg | ORAL_TABLET | Freq: Every day | ORAL | Status: DC

## 2022-04-19 NOTE — TOC Initial Note (Signed)
Transition of Care [facility]) - Initial/Assessment Note    Patient Details  Name: Kristen Potts MRN: [id number] Date of Birth: 1945-07-12  Transition of Care [facility]) CM/SW Contact:    Valente David, RN Phone Number: 04/19/2022, 1:48 PM  Clinical Narrative:                  Patient admitted from home after being at home a day post back surgery for lumbar stenosis/decompression.  She usually lives alone but has had her sister in the home with her since discharge.  Primary PCP is Dr. Sabra Heck, [facility].  She has rolling walker and BSC in the home.  Spoke with patient and son, notified of PT recommendations for discharge to SNF for short term rehab.  Denies that patient has had SNF stay previously, does not have a preference of where patient should go but does not want her to go to [facility].  Agrees to have bed search initiated at [facility], [facility], [facility], and [facility].  PASRR number obtained, FL2 complete.  TOC team will update patient and family on bed status.  Expected Discharge Plan: Skilled Nursing Facility Barriers to Discharge: Continued Medical Work up   Patient Goals and CMS Choice Patient states their goals for this hospitalization and ongoing recovery are:: Skilled nursing facility for short term rehab CMS Medicare.gov Compare Post Acute Care list provided to:: Patient Represenative (must comment) (Son, Herbie Baltimore) Choice offered to / list presented to : Adult Children  Expected Discharge Plan and Services Expected Discharge Plan: [place]   Discharge Planning Services: CM Consult Post Acute Care Choice: [place] Living arrangements for the past 2 months: Single Family Home                                      Prior Living Arrangements/Services Living arrangements for the past 2 months: Single Family Home Lives with:: Self Patient language and need for interpreter reviewed:: Yes Do you feel safe  going back to the place where you live?: No   Family agrees patient need SNF  Need for Family Participation in Patient Care: Yes (Comment) Care giver support system in place?: Yes (comment) Current home services: DME Criminal Activity/Legal Involvement Pertinent to Current Situation/Hospitalization: No - Comment as needed  Activities of Daily Living Home Assistive Devices/Equipment: None ADL Screening (condition at time of admission) Patient's cognitive ability adequate to safely complete daily activities?: Yes Is the patient deaf or have difficulty hearing?: No Does the patient have difficulty seeing, even when wearing glasses/contacts?: No Does the patient have difficulty concentrating, remembering, or making decisions?: No Patient able to express need for assistance with ADLs?: Yes Does the patient have difficulty dressing or bathing?: No Independently performs ADLs?: Yes (appropriate for developmental age) Does the patient have difficulty walking or climbing stairs?: Yes Weakness of Legs: Both Weakness of Arms/Hands: None  Permission Sought/Granted Permission sought to share information with : Case Manager, Other (comment) Permission granted to share information with : Yes, Verbal Permission Granted  Share Information with NAME: [place] granted to share info w AGENCY: SNFs        Emotional Assessment   Attitude/Demeanor/Rapport: Engaged Affect (typically observed): Calm Orientation: : Oriented to Self, Oriented to Place, Oriented to  Time, Oriented to Situation   Psych Involvement: No (comment)  Admission diagnosis:  Falls [W19.PPJK] Fall, initial encounter [  W19.XXXA] Fever [R50.9] Patient Active Problem List   Diagnosis Date Noted   Fever 79/43/2761   Acute metabolic encephalopathy 47/03/2956   Hypokalemia 04/18/2022   Falls 04/17/2022   Constipation 04/17/2022   Tobacco abuse 04/17/2022   Abnormal EKG 04/17/2022   Elevated glucose 04/17/2022    Elevated serum free T4 level 04/17/2022   Essential hypertension 04/17/2022   Neurogenic claudication due to lumbar spinal stenosis 04/15/2022   Synovial cyst of lumbar facet joint 04/15/2022   Neurogenic claudication 04/15/2022   GERD (gastroesophageal reflux disease) 10/05/2017   Osteoarthritis 10/05/2017   Tubular adenoma 09/01/2017   Recurrent major depressive disorder, in partial remission ([place]) 02/22/2017   Medicare annual wellness visit, initial 10/26/2016   Low serum vitamin D 06/30/2016   Panlobular emphysema ([place]) 06/30/2016   Hyperlipidemia, mixed 11/25/2015   Adult idiopathic generalized osteoporosis 07/17/2014   PCP:  Rusty Aus, MD Pharmacy:   [place], [place] [address] [place] [address] Phone: [phone] Fax: [phone]  [id number] [place] [place] [address] [place]Phone: [phone]: [phone]    Social Determinants of Health (SDOH) Interventions    Readmission Risk Interventions     No data to display

## 2022-04-19 NOTE — Assessment & Plan Note (Deleted)
101 overnight.  I ordered a chest x-ray which was reported as negative.  Could not send off a urine analysis and she had a catheter placed.  Empirically gave Rocephin and Zithromax.  Blood cultures drawn before antibiotics.  Incentive spirometer.  Just now reported that she was having diarrhea so I will send off stool studies including C. difficile.

## 2022-04-19 NOTE — Assessment & Plan Note (Signed)
Improving.  Continue to give Diflucan.

## 2022-04-19 NOTE — Progress Notes (Signed)
Progress Note   Patient: Kristen Potts GXQ:[id number] DOB: Jul 05, 1946 DOA: 04/17/2022     0 DOS: the patient was seen and examined on 04/19/2022    Assessment and Plan: * Fever 101 overnight.  I ordered a chest x-ray which was reported as negative.  Could not send off a urine analysis and she had a catheter placed.  Empirically gave Rocephin and Zithromax.  Blood cultures drawn before antibiotics.  Incentive spirometer.  Just now reported that she was having diarrhea so I will send off stool studies including C. difficile.  Acute metabolic encephalopathy MRI of the brain negative for stroke.  Family still concerned about her mental status not being back to her normal.  Holding medications that can cause altered mental status.  Tylenol for pain.  Fever and infection could potentially cause altered mental status also.  Fever work-up underway.  Diarrhea This was not reported this morning when I saw her but throughout the day patient had numerous episodes of diarrhea.  Send off stool studies.  Hypokalemia K-Phos replacement today  Hypophosphatemia K-Phos IV today  Paroxysmal atrial fibrillation (HCC) As per neurosurgery no anticoagulation for at least a week.  Okay with DVT prophylaxis.  Toprol for rate control.  Hypomagnesemia Replace magnesium today  Falls Physical therapy recommending rehab  Thrush Give Diflucan.  Essential hypertension Low-dose Toprol-XL at night.  Elevated serum free T4 level Mild elevation in free T4.  TSH normal range.  Follow-up TFTs as outpatient.  Tobacco abuse Nicotine patch.   Synovial cyst of lumbar facet joint Status post removal by Dr. Izora Ribas.  I had Dr. Lacinda Axon evaluate.  He cleared for physical therapy to see.  Recurrent major depressive disorder, in partial remission (HCC) Continue Elavil.   Panlobular emphysema (HCC) Respiratory status stable   Hyperlipidemia, mixed Crestor         Subjective: Patient feels okay.  Offers  no complaints.  Had a fever above 101 overnight.  No cough.  No diarrhea when I saw her this morning.   No nausea or vomiting.  Back pain is okay.  Nursing staff reported to me numerous episodes of diarrhea.  Physical Exam: Vitals:   04/18/22 2244 04/19/22 0041 04/19/22 0420 04/19/22 0500  BP: (!) 157/90 138/73 (!) 156/72   Pulse: (!) 107 86 (!) 107   Resp:  16 16   Temp:  (!) 101.2 F (38.4 C) 99.1 F (37.3 C)   TempSrc:  Axillary    SpO2:  95% 96%   Weight:    79.2 kg  Height:       Physical Exam HENT:     Head: Normocephalic.     Mouth/Throat:     Pharynx: No oropharyngeal exudate.  Eyes:     General: Lids are normal.     Conjunctiva/sclera: Conjunctivae normal.  Cardiovascular:     Rate and Rhythm: Normal rate and regular rhythm.     Heart sounds: Normal heart sounds, S1 normal and S2 normal.  Pulmonary:     Breath sounds: No decreased breath sounds, wheezing, rhonchi or rales.  Abdominal:     Palpations: Abdomen is soft.     Tenderness: There is no abdominal tenderness.  Musculoskeletal:     Right lower leg: Swelling present.     Left lower leg: Swelling present.  Skin:    General: Skin is warm.     Findings: No rash.     Comments: Bruising on extremities.  Neurological:     Mental Status: She is alert.  Comments: Answers some simple yes/no questions appropriately     Data Reviewed: Potassium 3.0, phosphorus 1.5, magnesium 1.9, chest x-ray read as negative, echocardiogram normal  Family Communication: Spoke with family at bedside and outside the room.  Disposition: Status is: Inpatient Remains inpatient appropriate because: Fever of 101 last night, went into atrial fibrillation last night.  Family still concerned about her mental status.  Planned Discharge Destination: Rehab    Time spent: 28 minutes  Author: Loletha Grayer, MD 04/19/2022 2:17 PM  For on call review [email or web address].

## 2022-04-19 NOTE — Progress Notes (Signed)
Foley discontinued. Pt encouraged to consume oral fluids and callout for assistance going to [facility].

## 2022-04-19 NOTE — Assessment & Plan Note (Addendum)
Replaced. °

## 2022-04-19 NOTE — Assessment & Plan Note (Addendum)
Replaced into the normal range today.

## 2022-04-19 NOTE — NC FL2 (Signed)
[place] LEVEL OF CARE SCREENING TOOL     IDENTIFICATION  Patient Name: Kristen Potts Birthdate: 1945/10/15 Sex: female Admission Date (Current Location): 04/17/2022  [facility] and [place] Number:  Engineering geologist and Address:  [facility], [address] [place],  [address]      Provider Number: [id number]  Attending Physician Name and Address:  Loletha Grayer, MD  Relative Name and Phone Number:  Nayelis Bonito [phone]    Current Level of Care: Hospital Recommended Level of Care: [place] Prior Approval Number:    Date Approved/Denied:   PASRR Number: [id number]  Discharge Plan: SNF    Current Diagnoses: Patient Active Problem List   Diagnosis Date Noted   Fever 83/41/9622   Acute metabolic encephalopathy 29/79/8921   Hypokalemia 04/18/2022   Falls 04/17/2022   Constipation 04/17/2022   Tobacco abuse 04/17/2022   Abnormal EKG 04/17/2022   Elevated glucose 04/17/2022   Elevated serum free T4 level 04/17/2022   Essential hypertension 04/17/2022   Neurogenic claudication due to lumbar spinal stenosis 04/15/2022   Synovial cyst of lumbar facet joint 04/15/2022   Neurogenic claudication 04/15/2022   GERD (gastroesophageal reflux disease) 10/05/2017   Osteoarthritis 10/05/2017   Tubular adenoma 09/01/2017   Recurrent major depressive disorder, in partial remission ([place]) 02/22/2017   Medicare annual wellness visit, initial 10/26/2016   Low serum vitamin D 06/30/2016   Panlobular emphysema ([place]) 06/30/2016   Hyperlipidemia, mixed 11/25/2015   Adult idiopathic generalized osteoporosis 07/17/2014    Orientation RESPIRATION BLADDER Height & Weight     Self, Time, Situation, Place  Normal Continent Weight: 79.2 kg Height:  '5\' 3"'$  (160 cm)  BEHAVIORAL SYMPTOMS/MOOD NEUROLOGICAL BOWEL NUTRITION STATUS      Continent Diet  AMBULATORY STATUS COMMUNICATION OF NEEDS Skin   Limited Assist Verbally  Normal                       Personal Care Assistance Level of Assistance  Bathing, Dressing Bathing Assistance: Limited assistance   Dressing Assistance: Limited assistance     Functional Limitations Info             SPECIAL CARE FACTORS FREQUENCY  PT (By licensed PT), OT (By licensed OT)     PT Frequency: 5 times a week OT Frequency: 5 times a week            Contractures Contractures Info: Not present    Additional Factors Info  Code Status, Allergies Code Status Info: Full Allergies Info: Codeine Not Specified  Nausea Only   Mirtazapine Not Specified  Other (See Comments) Didn't work Other reaction(s): Dizziness, Other (See Comments) Didn't work  Sulfa Antibiotics Not Specified  Other (See Comments)   Tramadol Not Specified  Other (See Comments) Feels bad  Benzodiazepines Low  Anxiet           Current Medications (04/19/2022):  This is the current hospital active medication list Current Facility-Administered Medications  Medication Dose Route Frequency Provider Last Rate Last Admin   0.9 %  sodium chloride infusion   Intravenous PRN Para Skeans, MD 10 mL/hr at 04/18/22 0531 Infusion Verify at 04/18/22 0531   acetaminophen (TYLENOL) tablet 650 mg  650 mg Oral Q6H PRN Para Skeans, MD   650 mg at 04/18/22 1711   Or   acetaminophen (TYLENOL) suppository 650 mg  650 mg Rectal Q6H PRN Para Skeans, MD       amitriptyline (  ELAVIL) tablet 50 mg  50 mg Oral QHS Florina Ou V, MD   50 mg at 04/18/22 2245   [START ON 04/20/2022] azithromycin (ZITHROMAX) tablet 250 mg  250 mg Oral Daily Wieting, Richard, MD       cefTRIAXone (ROCEPHIN) 2 g in sodium chloride 0.9 % 100 mL IVPB  2 g Intravenous Q24H Lorna Dibble, RPH 200 mL/hr at 04/19/22 1125 2 g at 04/19/22 1125   enoxaparin (LOVENOX) injection 40 mg  40 mg Subcutaneous Q24H Wieting, Richard, MD   40 mg at 04/19/22 1214   [START ON 04/20/2022] fluconazole (DIFLUCAN) IVPB 100 mg  100 mg Intravenous Q24H Wieting,  Richard, MD       fluticasone (FLONASE) 50 MCG/ACT nasal spray 2 spray  2 spray Each Nare Daily Para Skeans, MD       metoprolol succinate (TOPROL-XL) 24 hr tablet 25 mg  25 mg Oral Daily Wieting, Richard, MD   25 mg at 04/19/22 0956   nicotine (NICODERM CQ - dosed in mg/24 hours) patch 21 mg  21 mg Transdermal Daily Florina Ou V, MD   21 mg at 04/19/22 1002   ondansetron (ZOFRAN) 8 mg in sodium chloride 0.9 % 50 mL IVPB  8 mg Intravenous Q8H PRN Para Skeans, MD       pantoprazole (PROTONIX) injection 40 mg  40 mg Intravenous Q12H Florina Ou V, MD   40 mg at 04/18/22 2245   potassium chloride SA (KLOR-CON M) CR tablet 40 mEq  40 mEq Oral Daily Loletha Grayer, MD   40 mEq at 04/19/22 0956   potassium PHOSPHATE 30 mmol in dextrose 5 % 500 mL infusion  30 mmol Intravenous Once Wieting, Richard, MD       rosuvastatin (CRESTOR) tablet 5 mg  5 mg Oral QHS Wieting, Richard, MD   5 mg at 04/18/22 2245   sodium chloride flush (NS) 0.9 % injection 10-40 mL  10-40 mL Intracatheter Q12H Wieting, Richard, MD   10 mL at 04/19/22 1220   sodium chloride flush (NS) 0.9 % injection 10-40 mL  10-40 mL Intracatheter PRN Loletha Grayer, MD         Discharge Medications: Please see discharge summary for a list of discharge medications.  Relevant Imaging Results:  Relevant Lab Results:   Additional Information SSN=[id number]  Valente David, RN

## 2022-04-19 NOTE — Assessment & Plan Note (Signed)
This was not reported this morning when I saw her but throughout the day patient had numerous episodes of diarrhea.  Send off stool studies.

## 2022-04-19 NOTE — Assessment & Plan Note (Signed)
As per neurosurgery no anticoagulation for at least a week.  Okay with DVT prophylaxis.  Toprol for rate control.

## 2022-04-19 NOTE — Progress Notes (Signed)
C. difficile positive.  Start p.o. vancomycin.  Get rid of other antibiotics.  Likely the source of her fever.

## 2022-04-19 NOTE — Plan of Care (Signed)

## 2022-04-19 NOTE — Progress Notes (Signed)
A midline has been ordered for this patient. The [facility]AST RN has reviewed the patient's medical record including any arm restrictions, current creatinine clearance, length IV therapy is needed, and infusions needed/ordered to determine if a midline is the appropriate line for this individual patient. If there are contraindications, the physician and primary RN has been contacted by [facility] RN for further discussion. Midline Education: a midline is a long peripheral IV placed in the upper arm with the tip located at or near the axilla and distal to the shoulder should not be used as a CLABSI preventative measure   it has one lumen only it can remain in place for up to 29 days  Safe for Vancomycin infusion LESS THAN 6 days Is safe for power injection if good blood return can be obtained and line flushes easily it CANNOT be used for continuous infusion of vesicants. Including TPN and chemotherapy Should NOT be placed for sole intent of obtaining labs as there is no guarantee blood can be successfully drawn from line  contraindicated in patients with thrombosis, hypercoagulability, decreased venous flow to the extremities, ESRD (without a nephrologist's approval), small vessels, allergy to polyurethane, or known/suspected presence of a device-related infection, bacteremia, or septicemia .  Placed L Upper arm cephalic vein Midline with u;trasound. Good blood return obtained, flushed easily with 50m Normal saline. Vein depth 1cm

## 2022-04-20 ENCOUNTER — Telehealth (HOSPITAL_COMMUNITY): Payer: Self-pay | Admitting: Pharmacy Technician

## 2022-04-20 ENCOUNTER — Other Ambulatory Visit (HOSPITAL_COMMUNITY): Payer: Self-pay

## 2022-04-20 DIAGNOSIS — A0472 Enterocolitis due to Clostridium difficile, not specified as recurrent: Secondary | ICD-10-CM

## 2022-04-20 DIAGNOSIS — K219 Gastro-esophageal reflux disease without esophagitis: Secondary | ICD-10-CM

## 2022-04-20 DIAGNOSIS — G9341 Metabolic encephalopathy: Secondary | ICD-10-CM | POA: Diagnosis not present

## 2022-04-20 DIAGNOSIS — E876 Hypokalemia: Secondary | ICD-10-CM | POA: Diagnosis not present

## 2022-04-20 LAB — BASIC METABOLIC PANEL
Anion gap: 7 (ref 5–15)
BUN: 8 mg/dL (ref 8–23)
CO2: 22 mmol/L (ref 22–32)
Calcium: 7.7 mg/dL — ABNORMAL LOW (ref 8.9–10.3)
Chloride: 107 mmol/L (ref 98–111)
Creatinine, Ser: 0.62 mg/dL (ref 0.44–1.00)
GFR, Estimated: >60 mL/min (ref >60)
Glucose, Bld: 105 mg/dL — ABNORMAL HIGH (ref 70–99)
Potassium: 2.8 mmol/L — ABNORMAL LOW (ref 3.5–5.1)
Sodium: 136 mmol/L (ref 135–145)

## 2022-04-20 LAB — MAGNESIUM: Magnesium: 2.1 mg/dL (ref 1.7–2.4)

## 2022-04-20 LAB — PHOSPHORUS: Phosphorus: 2.3 mg/dL — ABNORMAL LOW (ref 2.5–4.6)

## 2022-04-20 MED ORDER — POTASSIUM CHLORIDE 10 MEQ/100ML IV SOLN
10.0000 meq | INTRAVENOUS | Status: AC
  Administered 2022-04-20 (×3): 10 meq via INTRAVENOUS
  Filled 2022-04-20 (×3): qty 100

## 2022-04-20 MED ORDER — HYDROCORTISONE (PERIANAL) 2.5 % EX CREA
TOPICAL_CREAM | Freq: Four times a day (QID) | CUTANEOUS | Status: DC
  Filled 2022-04-20 (×2): qty 28.35

## 2022-04-20 MED ORDER — POTASSIUM & SODIUM PHOSPHATES 280-160-250 MG PO PACK
1.0000 | PACK | Freq: Three times a day (TID) | ORAL | Status: AC
  Administered 2022-04-20 – 2022-04-21 (×3): 1 via ORAL
  Filled 2022-04-20 (×3): qty 1

## 2022-04-20 MED ORDER — POTASSIUM CHLORIDE IN NACL 40-0.9 MEQ/L-% IV SOLN
INTRAVENOUS | Status: DC
  Filled 2022-04-20 (×2): qty 1000

## 2022-04-20 MED ORDER — POTASSIUM CHLORIDE CRYS ER 20 MEQ PO TBCR
40.0000 meq | EXTENDED_RELEASE_TABLET | Freq: Three times a day (TID) | ORAL | Status: AC
  Administered 2022-04-20 (×3): 40 meq via ORAL
  Filled 2022-04-20 (×3): qty 2

## 2022-04-20 MED ORDER — CLONAZEPAM 1 MG PO TABS
1.0000 mg | ORAL_TABLET | Freq: Every day | ORAL | Status: DC | PRN
  Administered 2022-04-20 – 2022-04-21 (×2): 1 mg via ORAL
  Filled 2022-04-20 (×2): qty 1

## 2022-04-20 NOTE — Progress Notes (Signed)
Progress Note   Patient: Kristen Potts HDQ:[id number] DOB: 01-Nov-1945 DOA: 04/17/2022     1 DOS: the patient was seen and examined on 04/20/2022       Assessment and Plan: * C. difficile colitis I suspect this was the cause of the patient's fever.  I also suspect this could have been the cause of the patient's altered mental status at home.  Present on admission.  Started treatment yesterday with vancomycin 125 every 6 hours.  Monitor diarrhea.  Acute metabolic encephalopathy MRI of the brain negative for stroke.  Improving.  Could be secondary to C. difficile infection.  Okay to go back on clonazepam.  Hypokalemia Replace potassium orally and IV.  Hypophosphatemia Replace Neutra-Phos packets  Paroxysmal atrial fibrillation ([place]) As per neurosurgery no anticoagulation for at least a week.  Okay with DVT prophylaxis.  Toprol for rate control.  Hypomagnesemia Replaced yesterday.  Falls Physical therapy recommending rehab  Thrush Improving.  Continue to give Diflucan.  Essential hypertension Low-dose Toprol-XL at night.  Elevated serum free T4 level Mild elevation in free T4.  TSH normal range.  Follow-up TFTs as outpatient.  Tobacco abuse Nicotine patch.   Synovial cyst of lumbar facet joint Case discussed with Dr. Cari Caraway.  Continue working with physical therapy.  He will follow-up as outpatient.  Recurrent major depressive disorder, in partial remission (HCC) Continue Elavil.   Panlobular emphysema (HCC) Respiratory status stable   Hyperlipidemia, mixed Crestor         Subjective: Patient had quite a bit of diarrhea yesterday.  Had a little bit of blood from hemorrhoids.  Hemoglobin is better.  Asking for her clonazepam.  Potassium very low this morning.  Diagnosed with C. difficile colitis.  Physical Exam: Vitals:   04/19/22 0500 04/20/22 0027 04/20/22 0500 04/20/22 0736  BP:  119/64  (!) 141/107  Pulse:  (!) 105  91  Resp:  16    Temp:   98.5 F (36.9 C)  98.1 F (36.7 C)  TempSrc:    Oral  SpO2:  96%  98%  Weight: 79.2 kg  79.3 kg   Height:       Physical Exam HENT:     Head: Normocephalic.     Mouth/Throat:     Pharynx: No oropharyngeal exudate.  Eyes:     General: Lids are normal.     Conjunctiva/sclera: Conjunctivae normal.  Cardiovascular:     Rate and Rhythm: Normal rate and regular rhythm.     Heart sounds: Normal heart sounds, S1 normal and S2 normal.  Pulmonary:     Breath sounds: No decreased breath sounds, wheezing, rhonchi or rales.  Abdominal:     Palpations: Abdomen is soft.     Tenderness: There is no abdominal tenderness.  Musculoskeletal:     Right lower leg: Swelling present.     Left lower leg: Swelling present.  Skin:    General: Skin is warm.     Findings: No rash.     Comments: Bruising on extremities.  Neurological:     Mental Status: She is alert.     Comments: Answering questions appropriately.     Data Reviewed: C. difficile testing positive Potassium 2.8, phosphorus 2.3  Family Communication: Spoke with son at the bedside  Disposition: Status is: Inpatient Remains inpatient appropriate because: Starting treatment for C. difficile.  Will need a rehab bed.  Planned Discharge Destination: Rehab    Time spent: 27 minutes  Author: Loletha Grayer, MD 04/20/2022 2:16 PM  For on call review [email or web address].

## 2022-04-20 NOTE — TOC Progression Note (Signed)
Transition of Care [facility]) - Progression Note    Patient Details  Name: Bailei Buist MRN: [id number] Date of Birth: [birth year]  Transition of Care [facility]) CM/SW [place], RN Phone Number: 04/20/2022, 1:35 PM  Clinical Narrative:    Left a message for Herbie Baltimore the patient's son for a call back at [phone]    Expected Discharge Plan: [place] Barriers to Discharge: Continued Medical Work up  Expected Discharge Plan and Services Expected Discharge Plan: [place]   Discharge Planning Services: CM Consult Post Acute Care Choice: [place] Living arrangements for the past 2 months: Single Family Home                                       Social Determinants of Health (SDOH) Interventions    Readmission Risk Interventions     No data to display

## 2022-04-20 NOTE — TOC Progression Note (Signed)
Transition of Care [facility]) - Progression Note    Patient Details  Name: Kristen Potts MRN: [id number] Date of Birth: 25-Mar-1946  Transition of Care [facility]) CM/SW [place], RN Phone Number: 04/20/2022, 2:39 PM  Clinical Narrative:     Spoke with  the patient's son Herbie [place], Reviewed the bed offer [facility] accepted [facility], [employer] auth pending to go to [facility] once approved  Expected Discharge Plan: [place] Barriers to Discharge: Continued Medical Work up  Expected Discharge Plan and Services Expected Discharge Plan: [place]   Discharge Planning Services: CM Consult Post Acute Care Choice: [place] Living arrangements for the past 2 months: Single Family Home                                       Social Determinants of Health (SDOH) Interventions    Readmission Risk Interventions     No data to display

## 2022-04-20 NOTE — Plan of Care (Signed)

## 2022-04-20 NOTE — Telephone Encounter (Signed)
Pharmacy Patient Advocate Encounter  Insurance verification completed.    The patient is insured through [employer] Part D   The patient is currently admitted and ran test claims for the following: Vancomycin.  Copays and coinsurance results were relayed to Inpatient clinical team.

## 2022-04-20 NOTE — TOC Benefit Eligibility Note (Addendum)
Patient Teacher, English as a foreign language completed.    The patient is currently admitted and upon discharge could be taking vancomycin 125 mg capsules.  The current 10 day co-pay is $99.85.   The patient is currently admitted and upon discharge could be taking Dificid 200 mg.  The current 10 day co-pay is $1,620.78.   The patient is insured through [place], [place] Patient Advocate Specialist [place] Patient Advocate Team Direct Number: [phone]  Fax: [phone]

## 2022-04-20 NOTE — Progress Notes (Signed)
Physical Therapy Treatment Patient Details Name: Kristen Potts MRN: [id number] DOB: 08-23-1945 Today's Date: 04/20/2022   History of Present Illness Kristen Potts is a 84yoF readmit after fall 1 day s/p DC from here s/p L4-5 lumbar decompression, central laminectomy and B medial facetectomies, foraminotomies on 04/15/22, cyst removal. Pt now returns having a fall at home, reports her leg gave out due to chronic neurologic weakness- pt tells author she was trying to AMB out of kitchen to bathroom without her walker. PMH includes anxiety, arthritis, COPD, depression. GERD, glaucoma, HLD, HTN.    PT Comments    Pt in bed finishing up cleanup and linen change with RN. Pt assisted to EOB, then STS from EOB for linen stripping. Pt able to progress AMB tolerance/distance to 181f with increased foot drag on left. Pt has improved safety awareness today compared to last session. Pt still requires heavy physical assistance for bed mobility, but can sleep in recliner as needed upon return to home. Pt left up in chair with 3 visitors at bedside.    Recommendations for follow up therapy are one component of a multi-disciplinary discharge planning process, led by the attending physician.  Recommendations may be updated based on patient status, additional functional criteria and insurance authorization.  Follow Up Recommendations  Skilled nursing-short term rehab (<3 hours/day) Can patient physically be transported by private vehicle: No   Assistance Recommended at Discharge Frequent or constant Supervision/Assistance  Patient can return home with the following A lot of help with walking and/or transfers;A lot of help with bathing/dressing/bathroom;Assistance with cooking/housework;Assist for transportation;Help with stairs or ramp for entrance   Equipment Recommendations  None recommended by PT    Recommendations for Other Services       Precautions / Restrictions Precautions Precautions: Fall;Back      Mobility  Bed Mobility   Bed Mobility: Sidelying to Sit   Sidelying to sit: Mod assist            Transfers Overall transfer level: Needs assistance Equipment used: Rolling walker (2 wheels) Transfers: Sit to/from Stand Sit to Stand: Min guard                Ambulation/Gait Ambulation/Gait assistance: Min guard Gait Distance (Feet): 100 Feet Assistive device: Rolling walker (2 wheels)         General Gait Details: progressively weaker LLE with inabiulity to DC foot drag after 565f  Stairs             Wheelchair Mobility    Modified Rankin (Stroke Patients Only)       Balance                                            Cognition Arousal/Alertness: Awake/alert Behavior During Therapy: Flat affect Overall Cognitive Status: Within Functional Limits for tasks assessed                                          Exercises      General Comments        Pertinent Vitals/Pain Pain Assessment Pain Assessment: No/denies pain    Home Living                          Prior Function  PT Goals (current goals can now be found in the care plan section) Acute Rehab PT Goals Patient Stated Goal: to go home PT Goal Formulation: With patient Time For Goal Achievement: 05/01/22 Potential to Achieve Goals: Fair Progress towards PT goals: Progressing toward goals    Frequency    Min 2X/week      PT Plan Current plan remains appropriate    Co-evaluation              AM-PAC PT "6 Clicks" Mobility   Outcome Measure  Help needed turning from your back to your side while in a flat bed without using bedrails?: A Lot Help needed moving from lying on your back to sitting on the side of a flat bed without using bedrails?: A Lot Help needed moving to and from a bed to a chair (including a wheelchair)?: A Little Help needed standing up from a chair using your arms (e.g., wheelchair or  bedside chair)?: A Little Help needed to walk in hospital room?: A Little Help needed climbing 3-5 steps with a railing? : A Lot 6 Click Score: 15    End of Session   Activity Tolerance: Patient tolerated treatment well;No increased pain;Patient limited by fatigue Patient left: with chair alarm set;with call bell/phone within reach;with family/visitor present Nurse Communication: Mobility status PT Visit Diagnosis: Other abnormalities of gait and mobility (R26.89);Difficulty in walking, not elsewhere classified (R26.2);Muscle weakness (generalized) (M62.81)     Time: 1601-0932 PT Time Calculation (min) (ACUTE ONLY): 24 min  Charges:  $Therapeutic Exercise: 23-37 mins                    10:01 PM, 04/20/22 Etta Grandchild, PT, DPT Physical Therapist - [facility]  [phone] ([place])    Montavius Subramaniam C 04/20/2022, 9:58 PM

## 2022-04-20 NOTE — Progress Notes (Signed)
   Progress Note   Date: 04/20/2022  Kristen Potts is POD#5 from her L4/5 synovial cyst resection  Subjective: She is doing better.  She had some issues with possible delirium over the weekend.  She has Cdiff confirmed yesterday.   Vital Signs: Temp:  [98.1 F (36.7 C)-98.5 F (36.9 C)] 98.1 F (36.7 C) (10/16 0736) Pulse Rate:  [91-105] 91 (10/16 0736) Resp:  [16] 16 (10/16 0027) BP: (119-141)/(64-107) 141/107 (10/16 0736) SpO2:  [96 %-98 %] 98 % (10/16 0736) Weight:  [79.3 kg] 79.3 kg (10/16 0500) Temp (24hrs), Avg:98.3 F (36.8 C), Min:98.1 F (36.7 C), Max:98.5 F (36.9 C)  Weight: 79.3 kg   Problem List Patient Active Problem List   Diagnosis Date Noted   Fever 04/19/2022   Hypophosphatemia 04/19/2022   Hypomagnesemia 04/19/2022   Paroxysmal atrial fibrillation (HCC) 04/19/2022   Diarrhea 04/19/2022   Thrush 40/04/2724   Acute metabolic encephalopathy 36/64/4034   Hypokalemia 04/18/2022   Falls 04/17/2022   Constipation 04/17/2022   Tobacco abuse 04/17/2022   Abnormal EKG 04/17/2022   Elevated glucose 04/17/2022   Elevated serum free T4 level 04/17/2022   Essential hypertension 04/17/2022   Neurogenic claudication due to lumbar spinal stenosis 04/15/2022   Synovial cyst of lumbar facet joint 04/15/2022   Neurogenic claudication 04/15/2022   GERD (gastroesophageal reflux disease) 10/05/2017   Osteoarthritis 10/05/2017   Tubular adenoma 09/01/2017   Recurrent major depressive disorder, in partial remission ([place]) 02/22/2017   Medicare annual wellness visit, initial 10/26/2016   Low serum vitamin D 06/30/2016   Panlobular emphysema ([place]) 06/30/2016   Hyperlipidemia, mixed 11/25/2015   Adult idiopathic generalized osteoporosis 07/17/2014    Medications: Scheduled Meds:  amitriptyline  50 mg Oral QHS   enoxaparin (LOVENOX) injection  40 mg Subcutaneous Q24H   fluconazole  100 mg Oral Daily   fluticasone  2 spray Each Nare Daily   metoprolol succinate  25  mg Oral Daily   nicotine  21 mg Transdermal Daily   pantoprazole (PROTONIX) IV  40 mg Intravenous Q12H   potassium chloride  40 mEq Oral TID   rosuvastatin  5 mg Oral QHS   sodium chloride flush  10-40 mL Intracatheter Q12H   vancomycin  125 mg Oral QID   Continuous Infusions:  sodium chloride 10 mL/hr at 04/18/22 0531   0.9 % NaCl with KCl 40 mEq / L 50 mL/hr at 04/20/22 0844   ondansetron (ZOFRAN) IV     potassium chloride 10 mEq (04/20/22 0846)   potassium PHOSPHATE IVPB (in mmol)     PRN Meds:.sodium chloride, acetaminophen **OR** acetaminophen, ondansetron (ZOFRAN) IV, sodium chloride flush  Labs:  Lab Results  Component Value Date   WBC 9.1 04/18/2022   WBC 8.4 04/17/2022   HCT 41.5 04/18/2022   HCT 41.8 04/17/2022   PLT 144 (L) 04/18/2022   PLT 154 04/17/2022   No results found for: "LABPT", "INR", "APTT"  Lab Results  Component Value Date   NA 136 04/20/2022   NA 136 04/19/2022   K 2.8 (L) 04/20/2022   K 3.0 (L) 04/19/2022   BUN 8 04/20/2022   BUN 8 04/19/2022    Lab Results  Component Value Date   MG 2.1 04/20/2022   Exam:  Doing well MAEW  Imaging: CT A/P: Lumbar spine laminectomy defect noted, no obvious fluid collection    A/P: Kristen Potts is overall stable neurologically.   - PTOT - Cdiff tx per primary  Meade Maw, MD

## 2022-04-20 NOTE — Assessment & Plan Note (Signed)
I suspect this was the cause of the patient's fever.  I also suspect this could have been the cause of the patient's altered mental status at home.  Present on admission.  Started treatment yesterday with vancomycin 125 every 6 hours.  Monitor diarrhea.

## 2022-04-21 ENCOUNTER — Encounter: Payer: Medicare HMO | Admitting: Neurosurgery

## 2022-04-21 DIAGNOSIS — E876 Hypokalemia: Secondary | ICD-10-CM | POA: Diagnosis not present

## 2022-04-21 DIAGNOSIS — K8020 Calculus of gallbladder without cholecystitis without obstruction: Secondary | ICD-10-CM

## 2022-04-21 DIAGNOSIS — G9341 Metabolic encephalopathy: Secondary | ICD-10-CM | POA: Diagnosis not present

## 2022-04-21 DIAGNOSIS — A0472 Enterocolitis due to Clostridium difficile, not specified as recurrent: Secondary | ICD-10-CM | POA: Diagnosis not present

## 2022-04-21 LAB — BASIC METABOLIC PANEL
Anion gap: 5 (ref 5–15)
BUN: <5 mg/dL — ABNORMAL LOW (ref 8–23)
CO2: 19 mmol/L — ABNORMAL LOW (ref 22–32)
Calcium: 8.5 mg/dL — ABNORMAL LOW (ref 8.9–10.3)
Chloride: 112 mmol/L — ABNORMAL HIGH (ref 98–111)
Creatinine, Ser: 0.53 mg/dL (ref 0.44–1.00)
GFR, Estimated: >60 mL/min (ref >60)
Glucose, Bld: 95 mg/dL (ref 70–99)
Potassium: 4.8 mmol/L (ref 3.5–5.1)
Sodium: 136 mmol/L (ref 135–145)

## 2022-04-21 LAB — PHOSPHORUS: Phosphorus: 2.8 mg/dL (ref 2.5–4.6)

## 2022-04-21 LAB — HEMOGLOBIN: Hemoglobin: 12.1 g/dL (ref 12.0–15.0)

## 2022-04-21 MED ORDER — CLONAZEPAM 1 MG PO TABS
1.0000 mg | ORAL_TABLET | Freq: Every day | ORAL | Status: DC
  Administered 2022-04-22 – 2022-04-23 (×2): 1 mg via ORAL
  Filled 2022-04-21 (×2): qty 1

## 2022-04-21 NOTE — Progress Notes (Signed)
Progress Note   Patient: Kristen Potts VOZ:[id number] DOB: 07-22-1945 DOA: 04/17/2022     2 DOS: the patient was seen and examined on 04/21/2022   Brief hospital course: 76 year old female had a surgery on 04/15/2022 by Dr. Cari Potts for a synovial cyst removal and lumbar radiculopathy.  The patient was discharged home on 04/16/2022.  Other medical problems of anxiety, arthritis, COPD, depression, glaucoma, hyperlipidemia, hypertension.  She presented back to the hospital on 04/17/2022 with altered mental status and fall.  MRI of the brain was negative.  CT scan of the chest abdomen pelvis showed no evidence of pulmonary embolism, cholelithiasis with 7 mm stone seen in the gallbladder neck and correlate for symptoms.  Trace ascites fractures of the posterior right and 10th ribs with callus formation likely subacute or chronic, moderate coronary artery calcifications, emphysema.  The patient had a fever early morning on 04/19/2022.  Chest x-ray negative and blood cultures are negative.  The patient then developed diarrhea and stool for C. difficile was positive.  Patient was started on p.o. vancomycin on 04/19/2022.  We have been replacing electrolytes during the hospital course.  Aggressive replacement of phosphorus and potassium.      Assessment and Plan: * C. difficile colitis I suspect this was the cause of the patient's fever.  I also suspect this could have been the cause of the patient's altered mental status at home.  Present on admission.  Started treatment 04/19/2022 with vancomycin 125 every 6 hours.  The patient had 4 stage V bowel movements today.  Continue to monitor.  Acute metabolic encephalopathy MRI of the brain negative for stroke.  Improving.  Could be secondary to C. difficile infection.  Okay to go back on clonazepam.  Hypokalemia Replaced into the normal range today.  Stop IV potassium supplementation.  Hypophosphatemia Replaced into the normal range  today.  Paroxysmal atrial fibrillation (HCC) As per neurosurgery no anticoagulation for at least a week.  Okay with DVT prophylaxis.  Toprol for rate control.  Hypomagnesemia Replaced  Falls Physical therapy recommending rehab  Cholelithiasis CT scan stone near the gallbladder neck.  We will check liver function test tomorrow.  No right upper quadrant pain.  Thrush Improving.  Continue to give Diflucan.  Essential hypertension Low-dose Toprol-XL at night.  Elevated serum free T4 level Mild elevation in free T4.  TSH normal range.  Follow-up TFTs as outpatient.  Tobacco abuse Nicotine patch.   Synovial cyst of lumbar facet joint Case discussed with Dr. Cari Potts.  Continue working with physical therapy.  He will follow-up as outpatient.  Recurrent major depressive disorder, in partial remission (HCC) Continue Elavil.   Panlobular emphysema (HCC) Respiratory status stable   Hyperlipidemia, mixed Crestor         Subjective: Patient stated she has a few bowel movements overnight but not documented. I asked nursing staff to document bowel movements.  She had 4 bowel movements today.  Graded as stage V.  Treating for C. difficile colitis.  Physical Exam: Vitals:   04/20/22 1731 04/20/22 2352 04/21/22 0500 04/21/22 0914  BP: 120/76 (!) 148/92  (!) 144/71  Pulse: 88 93  (!) 101  Resp: 18     Temp: 98.7 F (37.1 C) (!) 97.5 F (36.4 C)  98.5 F (36.9 C)  TempSrc:  Oral    SpO2: 98%   96%  Weight:   80.1 kg   Height:       Physical Exam HENT:     Head: Normocephalic.  Mouth/Throat:     Pharynx: No oropharyngeal exudate.  Eyes:     General: Lids are normal.     Conjunctiva/sclera: Conjunctivae normal.  Cardiovascular:     Rate and Rhythm: Normal rate and regular rhythm.     Heart sounds: Normal heart sounds, S1 normal and S2 normal.  Pulmonary:     Breath sounds: No decreased breath sounds, wheezing, rhonchi or rales.  Abdominal:     Palpations:  Abdomen is soft.     Tenderness: There is no abdominal tenderness.  Musculoskeletal:     Right lower leg: Swelling present.     Left lower leg: Swelling present.  Skin:    General: Skin is warm.     Findings: No rash.     Comments: Bruising on extremities.  Neurological:     Mental Status: She is alert.     Comments: Answering questions appropriately.     Data Reviewed: Potassium up to 4.8, CO2 19, creatinine 0.53, phosphorus 2.8  Family Communication: Spoke with son on the phone  Disposition: Status is: Inpatient Remains inpatient appropriate because: Patient with 4 bowel movements this morning.  Continue to document bowel movements.  Continue treating for C. difficile with oral vancomycin.  Insurance authorization for rehab still pending.  Planned Discharge Destination: Rehab    Time spent: 28 minutes  Author: Loletha Grayer, MD 04/21/2022 3:18 PM  For on call review [email or web address].

## 2022-04-21 NOTE — Plan of Care (Signed)

## 2022-04-21 NOTE — TOC Progression Note (Signed)
Transition of Care [facility]) - Progression Note    Patient Details  Name: Kristen Potts MRN: [id number] Date of Birth: [birth year]  Transition of Care [facility]) CM/SW [place], RN Phone Number: 04/21/2022, 4:26 PM  Clinical Narrative:    approved from 10/17 - 10/23, review due on 10/24.  Cert is the same as above: [id number].  Reviewer is Pryor Montes, her number if needed: [phone]   Expected Discharge Plan: [place] Barriers to Discharge: Continued Medical Work up  Expected Discharge Plan and Services Expected Discharge Plan: [place]   Discharge Planning Services: CM Consult Post Acute Care Choice: [place] Living arrangements for the past 2 months: Single Family Home                                       Social Determinants of Health (SDOH) Interventions    Readmission Risk Interventions     No data to display

## 2022-04-21 NOTE — Assessment & Plan Note (Signed)
CT scan stone near the gallbladder neck.  We will check liver function test tomorrow.  No right upper quadrant pain.

## 2022-04-21 NOTE — Plan of Care (Signed)

## 2022-04-22 DIAGNOSIS — A0472 Enterocolitis due to Clostridium difficile, not specified as recurrent: Secondary | ICD-10-CM | POA: Diagnosis not present

## 2022-04-22 LAB — CBC
HCT: 37.7 % (ref 36.0–46.0)
Hemoglobin: 12 g/dL (ref 12.0–15.0)
MCH: 28.7 pg (ref 26.0–34.0)
MCHC: 31.8 g/dL (ref 30.0–36.0)
MCV: 90.2 fL (ref 80.0–100.0)
Platelets: 154 10*3/uL (ref 150–400)
RBC: 4.18 MIL/uL (ref 3.87–5.11)
RDW: 13.2 % (ref 11.5–15.5)
WBC: 4.6 10*3/uL (ref 4.0–10.5)
nRBC: 0 % (ref 0.0–0.2)

## 2022-04-22 LAB — COMPREHENSIVE METABOLIC PANEL
ALT: 19 U/L (ref 0–44)
AST: 34 U/L (ref 15–41)
Albumin: 2.6 g/dL — ABNORMAL LOW (ref 3.5–5.0)
Alkaline Phosphatase: 132 U/L — ABNORMAL HIGH (ref 38–126)
Anion gap: 7 (ref 5–15)
BUN: <5 mg/dL — ABNORMAL LOW (ref 8–23)
CO2: 24 mmol/L (ref 22–32)
Calcium: 8.7 mg/dL — ABNORMAL LOW (ref 8.9–10.3)
Chloride: 107 mmol/L (ref 98–111)
Creatinine, Ser: 0.65 mg/dL (ref 0.44–1.00)
GFR, Estimated: >60 mL/min (ref >60)
Glucose, Bld: 105 mg/dL — ABNORMAL HIGH (ref 70–99)
Potassium: 4.2 mmol/L (ref 3.5–5.1)
Sodium: 138 mmol/L (ref 135–145)
Total Bilirubin: 0.6 mg/dL (ref 0.3–1.2)
Total Protein: 5.5 g/dL — ABNORMAL LOW (ref 6.5–8.1)

## 2022-04-22 LAB — MAGNESIUM: Magnesium: 1.9 mg/dL (ref 1.7–2.4)

## 2022-04-22 LAB — PHOSPHORUS: Phosphorus: 4.7 mg/dL — ABNORMAL HIGH (ref 2.5–4.6)

## 2022-04-22 MED ORDER — ORAL CARE MOUTH RINSE: 15.0000 mL | OROMUCOSAL | Status: DC | PRN

## 2022-04-22 NOTE — Care Management Important Message (Signed)
Important Message  Patient Details  Name: Kristen Potts MRN: [id number] Date of Birth: 1945/08/15   Medicare Important Message Given:  Yes     Juliann Pulse A Can Lucci 04/22/2022, 11:29 AM

## 2022-04-22 NOTE — Progress Notes (Signed)
PROGRESS NOTE    Kristen Potts  PZW:[id number] DOB: 1945-10-01 DOA: 04/17/2022 PCP: Rusty Aus, MD    Brief Narrative:  76 year old female had a surgery on 04/15/2022 by Dr. Cari Caraway for a synovial cyst removal and lumbar radiculopathy.  The patient was discharged home on 04/16/2022.  Other medical problems of anxiety, arthritis, COPD, depression, glaucoma, hyperlipidemia, hypertension.  She presented back to the hospital on 04/17/2022 with altered mental status and fall.  MRI of the brain was negative.  CT scan of the chest abdomen pelvis showed no evidence of pulmonary embolism, cholelithiasis with 7 mm stone seen in the gallbladder neck and correlate for symptoms.  Trace ascites fractures of the posterior right and 10th ribs with callus formation likely subacute or chronic, moderate coronary artery calcifications, emphysema.   The patient had a fever early morning on 04/19/2022.  Chest x-ray negative and blood cultures are negative.  The patient then developed diarrhea and stool for C. difficile was positive.  Patient was started on p.o. vancomycin on 04/19/2022.   We have been replacing electrolytes during the hospital course.  Aggressive replacement of phosphorus and potassium.  Clinically improving.  Anticipate medical readiness for discharge 10/19.   Assessment & Plan:   Principal Problem:   C. difficile colitis Active Problems:   Acute metabolic encephalopathy   Hypokalemia   Hypophosphatemia   Hypomagnesemia   Paroxysmal atrial fibrillation (HCC)   Falls   GERD (gastroesophageal reflux disease)   Hyperlipidemia, mixed   Panlobular emphysema (HCC)   Recurrent major depressive disorder, in partial remission (HCC)   Synovial cyst of lumbar facet joint   Tobacco abuse   Elevated serum free T4 level   Essential hypertension   Thrush   Cholelithiasis  * C. difficile colitis Likely cause of patient's fever and altered mentation.  POA.  Started treatment 10/15.   Clinically improving. Plan: Continue p.o. vancomycin 125 mg every 6 hours Monitor overnight for Bms If diarrhea resolved anticipate discharge 4/23   Acute metabolic encephalopathy MRI of the brain negative for stroke.  Improving.  Could be secondary to C. difficile infection.  Okay to go back on clonazepam.  Mental status baseline   Hypokalemia Improved.  Monitor and replace as necessary   Hypophosphatemia Improved.  Monitor and replace as necessary   Paroxysmal atrial fibrillation ([place]) As per neurosurgery no anticoagulation for at least a week.  Okay with DVT prophylaxis.  Toprol for rate control.   Hypomagnesemia Replaced   Falls Physical therapy recommending rehab.  Plan to go to [facility] 7/19 if diarrhea resolved   Cholelithiasis CT scan stone near the gallbladder neck.  LFTs normal.  Outpatient follow-up    Thrush Improving.  Continue to give Diflucan.   Essential hypertension Low-dose Toprol-XL at night.   Elevated serum free T4 level Mild elevation in free T4.  TSH normal range.  Follow-up TFTs as outpatient.   Tobacco abuse Nicotine patch.   Synovial cyst of lumbar facet joint Case discussed with Dr. Cari Caraway.  Continue working with physical therapy.  He will follow-up as outpatient.   Recurrent major depressive disorder, in partial remission (HCC) Continue Elavil.     Panlobular emphysema (HCC) Respiratory status stable     Hyperlipidemia, mixed Crestor   DVT prophylaxis: SQ Lovenox Code Status: Full Family Communication: Son at bedside 10/18 Disposition Plan: Status is: Inpatient Remains inpatient appropriate because: C. difficile colitis.  Clinically improving.  Anticipate discharge 7/19   Level of care: Med-Surg  Consultants:  None  Procedures:  None  Antimicrobials: P.o. vancomycin   Subjective: Seen and examined.  Sitting in the chair.  No visible distress.  Offers no complaints.  Objective: Vitals:   04/21/22 2052  04/22/22 0500 04/22/22 0554 04/22/22 0822  BP: (!) 142/63  (!) 148/76 (!) 156/87  Pulse: 89  97 (!) 108  Resp: '20  18 18  '$ Temp: 98 F (36.7 C)  98 F (36.7 C) 97.8 F (36.6 C)  TempSrc: Oral  Oral Oral  SpO2: 98%  100% 96%  Weight:  77 kg    Height:       No intake or output data in the 24 hours ending 04/22/22 1138 Filed Weights   04/20/22 0500 04/21/22 0500 04/22/22 0500  Weight: 79.3 kg 80.1 kg 77 kg    Examination:  General exam: Appears calm and comfortable  Respiratory system: Clear to auscultation. Respiratory effort normal. Cardiovascular system: S1-S2, RRR, no murmurs, no pedal edema Gastrointestinal system: Soft, NT/ND, mildly hyperactive bowel sounds Central nervous system: Alert and oriented. No focal neurological deficits. Extremities: Symmetric 5 x 5 power. Skin: No rashes, lesions or ulcers Psychiatry: Judgement and insight appear normal. Mood & affect appropriate.     Data Reviewed: I have personally reviewed following labs and imaging studies  CBC: Recent Labs  Lab 04/17/22 1259 04/18/22 0106 04/21/22 0657 04/22/22 0544  WBC 8.4 9.1  --  4.6  HGB 13.2 13.5 12.1 12.0  HCT 41.8 41.5  --  37.7  MCV 91.1 88.3  --  90.2  PLT 154 144*  --  237   Basic Metabolic Panel: Recent Labs  Lab 04/17/22 1259 04/18/22 0106 04/18/22 1300 04/19/22 0523 04/20/22 0550 04/21/22 0657 04/22/22 0544  NA 137 135  --  136 136 136 138  K 3.1* 2.6* 3.9 3.0* 2.8* 4.8 4.2  CL 105 104  --  106 107 112* 107  CO2 26 21*  --  23 22 19* 24  GLUCOSE 108* 105*  --  109* 105* 95 105*  BUN 16 7*  --  8 8 <5* <5*  CREATININE 0.89 0.50  --  0.61 0.62 0.53 0.65  CALCIUM 8.4* 7.5*  --  7.7* 7.7* 8.5* 8.7*  MG 1.7 2.2  --  1.9 2.1  --  1.9  PHOS  --   --   --  1.5* 2.3* 2.8 4.7*   GFR: Estimated Creatinine Clearance: 56.9 mL/min (by C-G formula based on SCr of 0.65 mg/dL). Liver Function Tests: Recent Labs  Lab 04/17/22 1259 04/18/22 0106 04/22/22 0544  AST 23 18 34   ALT '11 10 19  '$ ALKPHOS 101 103 132*  BILITOT 0.9 0.9 0.6  PROT 6.0* 5.6* 5.5*  ALBUMIN 3.1* 2.9* 2.6*   No results for input(s): "LIPASE", "AMYLASE" in the last 168 hours. No results for input(s): "AMMONIA" in the last 168 hours. Coagulation Profile: No results for input(s): "INR", "PROTIME" in the last 168 hours. Cardiac Enzymes: No results for input(s): "CKTOTAL", "CKMB", "CKMBINDEX", "TROPONINI" in the last 168 hours. BNP (last 3 results) No results for input(s): "PROBNP" in the last 8760 hours. HbA1C: No results for input(s): "HGBA1C" in the last 72 hours. CBG: No results for input(s): "GLUCAP" in the last 168 hours. Lipid Profile: No results for input(s): "CHOL", "HDL", "LDLCALC", "TRIG", "CHOLHDL", "LDLDIRECT" in the last 72 hours. Thyroid Function Tests: No results for input(s): "TSH", "T4TOTAL", "FREET4", "T3FREE", "THYROIDAB" in the last 72 hours. Anemia Panel: No results for input(s): "VITAMINB12", "FOLATE", "FERRITIN", "TIBC", "IRON", "  RETICCTPCT" in the last 72 hours. Sepsis Labs: Recent Labs  Lab 04/17/22 1723  LATICACIDVEN 1.9    Recent Results (from the past 240 hour(s))  SARS Coronavirus 2 by RT PCR (hospital order, performed in [facility] hospital lab) *cepheid single result test* Anterior Nasal Swab     Status: None   Collection Time: 04/17/22  4:33 PM   Specimen: Anterior Nasal Swab  Result Value Ref Range Status   SARS Coronavirus 2 by RT PCR NEGATIVE NEGATIVE Final    Comment: (NOTE) SARS-CoV-2 target nucleic acids are NOT DETECTED.  The SARS-CoV-2 RNA is generally detectable in upper and lower respiratory specimens during the acute phase of infection. The lowest concentration of SARS-CoV-2 viral copies this assay can detect is 250 copies / mL. A negative result does not preclude SARS-CoV-2 infection and should not be used as the sole basis for treatment or other patient management decisions.  A negative result may occur with improper specimen  collection / handling, submission of specimen other than nasopharyngeal swab, presence of viral mutation(s) within the areas targeted by this assay, and inadequate number of viral copies (<250 copies / mL). A negative result must be combined with clinical observations, patient history, and epidemiological information.  Fact Sheet for Patients:   https://www.patel.info/  Fact Sheet for Healthcare Providers: https://hall.com/  This test is not yet approved or  cleared by the [place] FDA and has been authorized for detection and/or diagnosis of SARS-CoV-2 by FDA under an Emergency Use Authorization (EUA).  This EUA will remain in effect (meaning this test can be used) for the duration of the COVID-19 declaration under Section 564(b)(1) of the Act, 21 U.S.C. section 360bbb-3(b)(1), unless the authorization is terminated or revoked sooner.  Performed at [facility], [address] [place], [place] [address]   Culture, blood (Routine X 2) w Reflex to ID Panel     Status: None (Preliminary result)   Collection Time: 04/19/22  9:03 AM   Specimen: BLOOD  Result Value Ref Range Status   Specimen Description BLOOD LEFT ANTECUBITAL  Final   Special Requests   Final    BOTTLES DRAWN AEROBIC AND ANAEROBIC Blood Culture adequate volume   Culture   Final    NO GROWTH 3 DAYS Performed at [facility], [address], [place], [place] [address]    Report Status PENDING  Incomplete  Culture, blood (Routine X 2) w Reflex to ID Panel     Status: None (Preliminary result)   Collection Time: 04/19/22  9:46 AM   Specimen: BLOOD  Result Value Ref Range Status   Specimen Description BLOOD LEFT ANTECUBITAL  Final   Special Requests   Final    BOTTLES DRAWN AEROBIC AND ANAEROBIC Blood Culture adequate volume   Culture   Final    NO GROWTH 3 DAYS Performed at [facility], [address] [place], [place] [address]     Report Status PENDING  Incomplete  C Difficile Quick Screen w PCR reflex     Status: Abnormal   Collection Time: 04/19/22  2:01 PM   Specimen: STOOL  Result Value Ref Range Status   C Diff antigen POSITIVE (A) NEGATIVE Final   C Diff toxin POSITIVE (A) NEGATIVE Final   C Diff interpretation Toxin producing C. difficile detected.  Final    Comment: CRITICAL RESULT CALLED TO, READ BACK BY AND VERIFIED WITHJenny Reichmann [facility] AT 9147 04/19/22.PMF Performed at [facility], [address] [place],  [place] [address]          Radiology Studies: No results found.      Scheduled Meds:  amitriptyline  50 mg Oral QHS   clonazePAM  1 mg Oral Daily   enoxaparin (LOVENOX) injection  40 mg Subcutaneous Q24H   fluconazole  100 mg Oral Daily   fluticasone  2 spray Each Nare Daily   hydrocortisone   Rectal QID   metoprolol succinate  25 mg Oral Daily   nicotine  21 mg Transdermal Daily   pantoprazole (PROTONIX) IV  40 mg Intravenous Q12H   rosuvastatin  5 mg Oral QHS   sodium chloride flush  10-40 mL Intracatheter Q12H   vancomycin  125 mg Oral QID   Continuous Infusions:  sodium chloride 10 mL/hr at 04/18/22 0531   ondansetron (ZOFRAN) IV       LOS: 3 days     Sidney Ace, MD [facility]   If 7PM-7AM, please contact night-coverage  04/22/2022, 11:38 AM

## 2022-04-22 NOTE — Progress Notes (Signed)
Physical Therapy Treatment Patient Details Name: Kristen Potts MRN: [id number] DOB: 02-19-1946 Today's Date: 04/22/2022   History of Present Illness Kristen Potts is a 13yoF readmit after fall 1 day s/p DC from here s/p L4-5 lumbar decompression, central laminectomy and B medial facetectomies, foraminotomies on 04/15/22, cyst removal. Pt now returns having a fall at home, reports her leg gave out due to chronic neurologic weakness- pt tells author she was trying to AMB out of kitchen to bathroom without her walker. PMH includes anxiety, arthritis, COPD, depression. GERD, glaucoma, HLD, HTN.    PT Comments    Pt in bed, feeling better, already up to chair and back again this AM, her bottom hurts. Pt able to demonstrate LLE bed exercises with multimodal cues. STS from EOB remains weak, minA needed or elevated surface height. Once in standing, pt is fairly steady and safe with RW for pivot transfers. No seated balance concerns. Pt continues to progress toward goals. Session ended upon pt need to void. RN contacted regarding honeycomb dressing not sealed. Incision slightly erythematous..    Recommendations for follow up therapy are one component of a multi-disciplinary discharge planning process, led by the attending physician.  Recommendations may be updated based on patient status, additional functional criteria and insurance authorization.  Follow Up Recommendations  Skilled nursing-short term rehab (<3 hours/day) Can patient physically be transported by private vehicle: No   Assistance Recommended at Discharge Frequent or constant Supervision/Assistance  Patient can return home with the following A lot of help with walking and/or transfers;A lot of help with bathing/dressing/bathroom;Assistance with cooking/housework;Assist for transportation;Help with stairs or ramp for entrance   Equipment Recommendations  None recommended by PT    Recommendations for Other Services       Precautions /  Restrictions Precautions Precautions: Fall;Back Restrictions Weight Bearing Restrictions: No     Mobility  Bed Mobility Overal bed mobility: Needs Assistance Bed Mobility: Supine to Sit     Supine to sit: Supervision          Transfers Overall transfer level: Needs assistance Equipment used: Rolling walker (2 wheels) Transfers: Sit to/from Stand, Bed to chair/wheelchair/BSC Sit to Stand: Min assist Stand pivot transfers: Supervision (x2)              Ambulation/Gait                   Stairs             Wheelchair Mobility    Modified Rankin (Stroke Patients Only)       Balance                                            Cognition Arousal/Alertness: Awake/alert Behavior During Therapy: WFL for tasks assessed/performed Overall Cognitive Status: Within Functional Limits for tasks assessed                                          Exercises General Exercises - Lower Extremity Short Arc Quad: AROM, Left, 15 reps, Supine, Limitations Short Arc Quad Limitations: ankle DF cued but limited Heel Slides: AROM, Left, 15 reps, Supine Hip ABduction/ADduction: AROM, Left, 15 reps, Supine Other Exercises Other Exercises: STS 1x5 from elevated EOB Other Exercises: standing ADL/pericare after void attempt    General  Comments        Pertinent Vitals/Pain Pain Assessment Pain Assessment: No/denies pain    Home Living                          Prior Function            PT Goals (current goals can now be found in the care plan section) Acute Rehab PT Goals Patient Stated Goal: to go home PT Goal Formulation: With patient Time For Goal Achievement: 05/01/22 Potential to Achieve Goals: Fair Progress towards PT goals: Progressing toward goals    Frequency    Min 2X/week      PT Plan Current plan remains appropriate    Co-evaluation              AM-PAC PT "6 Clicks" Mobility    Outcome Measure  Help needed turning from your back to your side while in a flat bed without using bedrails?: A Little Help needed moving from lying on your back to sitting on the side of a flat bed without using bedrails?: A Little Help needed moving to and from a bed to a chair (including a wheelchair)?: A Lot Help needed standing up from a chair using your arms (e.g., wheelchair or bedside chair)?: A Lot Help needed to walk in hospital room?: A Little Help needed climbing 3-5 steps with a railing? : A Lot 6 Click Score: 15    End of Session   Activity Tolerance: Patient tolerated treatment well;No increased pain Patient left: in bed;with nursing/sitter in room Nurse Communication: Mobility status PT Visit Diagnosis: Other abnormalities of gait and mobility (R26.89);Difficulty in walking, not elsewhere classified (R26.2);Muscle weakness (generalized) (M62.81)     Time: 6144-3154 PT Time Calculation (min) (ACUTE ONLY): 16 min  Charges:  $Therapeutic Exercise: 8-22 mins                    11:56 AM, 04/22/22 Etta Grandchild, PT, DPT Physical Therapist - [facility]  [phone] ([place])    Aura Bibby C 04/22/2022, 11:53 AM

## 2022-04-23 ENCOUNTER — Encounter: Payer: Medicare HMO | Admitting: Orthopedic Surgery

## 2022-04-23 DIAGNOSIS — A0472 Enterocolitis due to Clostridium difficile, not specified as recurrent: Secondary | ICD-10-CM | POA: Diagnosis not present

## 2022-04-23 MED ORDER — VANCOMYCIN HCL 125 MG PO CAPS: 125.0000 mg | ORAL_CAPSULE | Freq: Four times a day (QID) | ORAL | 0 refills | Status: AC

## 2022-04-23 MED ORDER — OXYCODONE HCL 5 MG PO TABS: 5.0000 mg | ORAL_TABLET | ORAL | 0 refills | Status: DC | PRN

## 2022-04-23 MED ORDER — FLUCONAZOLE 100 MG PO TABS: 100.0000 mg | ORAL_TABLET | Freq: Every day | ORAL | 0 refills | Status: AC

## 2022-04-23 MED ORDER — HYDROCORTISONE (PERIANAL) 2.5 % EX CREA: TOPICAL_CREAM | Freq: Four times a day (QID) | CUTANEOUS | 0 refills | Status: AC | PRN

## 2022-04-23 MED ORDER — CLONAZEPAM 1 MG PO TABS: 1.0000 mg | ORAL_TABLET | Freq: Every day | ORAL | 0 refills | Status: DC | PRN

## 2022-04-23 MED ORDER — NICOTINE 21 MG/24HR TD PT24: 21.0000 mg | MEDICATED_PATCH | Freq: Every day | TRANSDERMAL | 0 refills | Status: AC

## 2022-04-23 NOTE — Progress Notes (Addendum)
Patient discharged to SNF. Report given to [facility], LPN All questions answered. Paperwork and belongings sent with transport. Honeycomb dressing to back fell off. Site clean and intact. Message attending for advice. Got instructions.

## 2022-04-23 NOTE — TOC Progression Note (Signed)
Transition of Care [facility]) - Progression Note    Patient Details  Name: Kristen Potts MRN: [id number] Date of Birth: 08-03-1945  Transition of Care [facility] CM/SW [place], RN Phone Number: 04/23/2022, 10:11 AM  Clinical Narrative:    Patient is going to [facility] room 603[facility] She will notify her family EMS called to transport   Expected Discharge Plan: [place] Barriers to Discharge: Barriers Resolved  Expected Discharge Plan and Services Expected Discharge Plan: [place]   Discharge Planning Services: CM Consult Post Acute Care Choice: [place] Living arrangements for the past 2 months: Single Family Home Expected Discharge Date: 04/23/22                                     Social Determinants of Health (SDOH) Interventions    Readmission Risk Interventions     No data to display

## 2022-04-23 NOTE — Discharge Summary (Signed)
Physician Discharge Summary  Elsye Mccollister IOX:[id number] DOB: August 24, 1945 DOA: 04/17/2022  PCP: Rusty Aus, MD  Admit date: 04/17/2022 Discharge date: 04/23/2022  Admitted From: Home Disposition:  SNF  Recommendations for Outpatient Follow-up:  Follow up with PCP in 1-2 weeks   Home Health:No Equipment/Devices:None   Discharge Condition:Stable  CODE STATUS:FULL  Diet recommendation: Heart healthy  Brief/Interim Summary:  76 year old female had a surgery on 04/15/2022 by Dr. Cari Caraway for a synovial cyst removal and lumbar radiculopathy.  The patient was discharged home on 04/16/2022.  Other medical problems of anxiety, arthritis, COPD, depression, glaucoma, hyperlipidemia, hypertension.  She presented back to the hospital on 04/17/2022 with altered mental status and fall.  MRI of the brain was negative.  CT scan of the chest abdomen pelvis showed no evidence of pulmonary embolism, cholelithiasis with 7 mm stone seen in the gallbladder neck and correlate for symptoms.  Trace ascites fractures of the posterior right and 10th ribs with callus formation likely subacute or chronic, moderate coronary artery calcifications, emphysema.   The patient had a fever early morning on 04/19/2022.  Chest x-ray negative and blood cultures are negative.  The patient then developed diarrhea and stool for C. difficile was positive.  Patient was started on p.o. vancomycin on 04/19/2022.   We have been replacing electrolytes during the hospital course.  Aggressive replacement of phosphorus and potassium.   Clinically improving.  Ready for DC to SNF.  1 soft BM reported on day of DC   Discharge Diagnoses:  Principal Problem:   C. difficile colitis Active Problems:   Acute metabolic encephalopathy   Hypokalemia   Hypophosphatemia   Hypomagnesemia   Paroxysmal atrial fibrillation (HCC)   Falls   GERD (gastroesophageal reflux disease)   Hyperlipidemia, mixed   Panlobular emphysema (HCC)    Recurrent major depressive disorder, in partial remission (HCC)   Synovial cyst of lumbar facet joint   Tobacco abuse   Elevated serum free T4 level   Essential hypertension   Thrush   Cholelithiasis  * C. difficile colitis Likely cause of patient's fever and altered mentation.  POA.  Started treatment 10/15.  Clinically improving. Plan: Continue p.o. vancomycin 125 mg every 6 hours 10 day course prescribed   Acute metabolic encephalopathy MRI of the brain negative for stroke.  Improving.  Could be secondary to C. difficile infection.  Okay to go back on clonazepam.  Mental status baseline   Hypokalemia Improved.  Monitor and replace as necessary   Hypophosphatemia Improved.  Monitor and replace as necessary   Paroxysmal atrial fibrillation ([place]) As per neurosurgery no anticoagulation for at least a week.   Okay with DVT prophylaxis.  Toprol for rate control.   Hypomagnesemia Replaced   Falls Physical therapy recommending rehab.  Plan to go to [facility] 7/19 if diarrhea resolved   Cholelithiasis CT scan stone near the gallbladder neck.  LFTs normal.  Outpatient follow-up    Thrush Improving.  Continue to give Diflucan. 7 day course prescribed   Essential hypertension Low-dose Toprol-XL at night.   Elevated serum free T4 level Mild elevation in free T4.  TSH normal range.  Follow-up TFTs as outpatient.   Tobacco abuse Nicotine patch.   Synovial cyst of lumbar facet joint Case discussed with Dr. Cari Caraway.  Continue working with physical therapy.  He will follow-up as outpatient.   Recurrent major depressive disorder, in partial remission (HCC) Continue Elavil.     Panlobular emphysema ([place]) Respiratory status stable  Hyperlipidemia, mixed Crestor  Discharge Instructions  Discharge Instructions     Diet - low sodium heart healthy   Complete by: As directed    Increase activity slowly   Complete by: As directed    No wound care   Complete  by: As directed       Allergies as of 04/23/2022       Reactions   Codeine Nausea Only   Mirtazapine Other (See Comments)   Didn't work Other reaction(s): Dizziness, Other (See Comments) Didn't work   Sulfa Antibiotics Other (See Comments)   Tramadol Other (See Comments)   Feels bad   Benzodiazepines Anxiety        Medication List     STOP taking these medications    methocarbamol 500 MG tablet Commonly known as: ROBAXIN       TAKE these medications    amitriptyline 25 MG tablet Commonly known as: ELAVIL Take 50 mg by mouth at bedtime.   amLODipine 5 MG tablet Commonly known as: NORVASC Take 5 mg by mouth at bedtime.   BIOFLEX PO Take 1 tablet by mouth daily.   Calcium Carbonate-Vitamin D 600-200 MG-UNIT Tabs Take 1 tablet by mouth daily.   clonazePAM 1 MG tablet Commonly known as: KLONOPIN Take 1 tablet (1 mg total) by mouth daily as needed for anxiety. SNF use only What changed: additional instructions   etodolac 400 MG tablet Commonly known as: LODINE Take 400 mg by mouth every morning.   Fish Oil 500 MG Caps Take 1 capsule by mouth daily.   fluconazole 100 MG tablet Commonly known as: DIFLUCAN Take 1 tablet (100 mg total) by mouth daily for 4 days.   fluticasone 50 MCG/ACT nasal spray Commonly known as: FLONASE Place 2 sprays into both nostrils daily.   furosemide 20 MG tablet Commonly known as: LASIX Take 20 mg by mouth as needed.   gabapentin 100 MG capsule Commonly known as: NEURONTIN Take 100 mg by mouth at bedtime.   hydrocortisone 2.5 % rectal cream Commonly known as: ANUSOL-HC Place rectally 4 (four) times daily as needed for hemorrhoids or anal itching.   metoprolol succinate 25 MG 24 hr tablet Commonly known as: TOPROL-XL Take 25 mg by mouth daily.   nicotine 21 mg/24hr patch Commonly known as: NICODERM CQ - dosed in mg/24 hours Place 1 patch (21 mg total) onto the skin daily.   omeprazole 20 MG capsule Commonly  known as: PRILOSEC TAKE 1 CAPSULE BY MOUTH USUALLY 30 MINUTES BEFORE BREAKFAST   oxyCODONE 5 MG immediate release tablet Commonly known as: Oxy IR/ROXICODONE Take 1 tablet (5 mg total) by mouth every 4 (four) hours as needed for moderate pain. SNF use only What changed:  reasons to take this additional instructions   rosuvastatin 5 MG tablet Commonly known as: CRESTOR Take 5 mg by mouth at bedtime.   vancomycin 125 MG capsule Commonly known as: VANCOCIN Take 1 capsule (125 mg total) by mouth 4 (four) times daily for 5 days.   vitamin B-12 500 MCG tablet Commonly known as: CYANOCOBALAMIN Take 500 mcg by mouth daily.   Vitamin D3 50 MCG (2000 UT) Tabs Take 2,000 Units by mouth daily.   Vitamin E 400 units Tabs Take by mouth daily.        Contact information for after-discharge care     Destination     [facility] Preferred SNF .   Service: Skilled Chiropodist information: [address] [place] [facility]  [phone]                    Allergies  Allergen Reactions   Codeine Nausea Only   Mirtazapine Other (See Comments)    Didn't work Other reaction(s): Dizziness, Other (See Comments) Didn't work   Sulfa Antibiotics Other (See Comments)   Tramadol Other (See Comments)    Feels bad   Benzodiazepines Anxiety    Consultations: None   Procedures/Studies: ECHOCARDIOGRAM COMPLETE  Result Date: 04/19/2022    ECHOCARDIOGRAM REPORT   Patient Name:   KAMARIAH Spangle Date of Exam: 04/18/2022 Medical Rec #:  [id number]     Height:       63.0 in Accession #:    [id number]    Weight:       171.0 lb Date of Birth:  1945/08/08     BSA:          1.809 m Patient Age:    59 years      BP:           124/55 mmHg Patient Gender: F             HR:           120 bpm. Exam Location:  [facility] Procedure: 2D Echo Indications:     Other cardiac sounds R01.2  History:         Patient has no prior history of Echocardiogram examinations.   Sonographer:     Kathlen Brunswick RDCS Referring Phys:  [id number] Loletha Grayer Diagnosing Phys: Yolonda Kida MD  Sonographer Comments: Technically difficult study due to poor echo windows and suboptimal parasternal window. Image acquisition challenging due to patient body habitus. IMPRESSIONS  1. Left ventricular ejection fraction, by estimation, is 55 to 60%. The left ventricle has normal function. The left ventricle has no regional wall motion abnormalities. Left ventricular diastolic parameters are consistent with Grade I diastolic dysfunction (impaired relaxation).  2. Right ventricular systolic function is normal. The right ventricular size is normal.  3. The mitral valve is normal in structure. No evidence of mitral valve regurgitation.  4. The aortic valve is normal in structure. Aortic valve regurgitation is not visualized. FINDINGS  Left Ventricle: Left ventricular ejection fraction, by estimation, is 55 to 60%. The left ventricle has normal function. The left ventricle has no regional wall motion abnormalities. The left ventricular internal cavity size was normal in size. There is  no left ventricular hypertrophy. Left ventricular diastolic parameters are consistent with Grade I diastolic dysfunction (impaired relaxation). Right Ventricle: The right ventricular size is normal. No increase in right ventricular wall thickness. Right ventricular systolic function is normal. Left Atrium: Left atrial size was normal in size. Right Atrium: Right atrial size was normal in size. Pericardium: There is no evidence of pericardial effusion. Mitral Valve: The mitral valve is normal in structure. No evidence of mitral valve regurgitation. Tricuspid Valve: The tricuspid valve is normal in structure. Tricuspid valve regurgitation is trivial. Aortic Valve: The aortic valve is normal in structure. Aortic valve regurgitation is not visualized. Aortic valve peak gradient measures 9.0 mmHg. Pulmonic Valve: The pulmonic  valve was normal in structure. Pulmonic valve regurgitation is not visualized. Aorta: The ascending aorta was not well visualized. IAS/Shunts: No atrial level shunt detected by color flow Doppler.  LEFT VENTRICLE PLAX 2D LVIDd:         4.50 cm     Diastology LVIDs:         3.20 cm  LV e' medial:    8.27 cm/s LV PW:         0.80 cm     LV E/e' medial:  6.6 LV IVS:        1.10 cm     LV e' lateral:   9.28 cm/s LVOT diam:     2.00 cm     LV E/e' lateral: 5.9 LV SV:         42 LV SV Index:   23 LVOT Area:     3.14 cm  LV Volumes (MOD) LV vol d, MOD A2C: 32.5 ml LV vol d, MOD A4C: 41.8 ml LV vol s, MOD A2C: 13.0 ml LV vol s, MOD A4C: 20.1 ml LV SV MOD A2C:     19.5 ml LV SV MOD A4C:     41.8 ml LV SV MOD BP:      20.7 ml RIGHT VENTRICLE RV Basal diam:  3.20 cm LEFT ATRIUM             Index        RIGHT ATRIUM          Index LA diam:        3.00 cm 1.66 cm/m   RA Area:     7.26 cm LA Vol (A2C):   21.7 ml 11.99 ml/m  RA Volume:   11.70 ml 6.47 ml/m LA Vol (A4C):   15.2 ml 8.40 ml/m LA Biplane Vol: 18.3 ml 10.12 ml/m  AORTIC VALVE AV Area (Vmax): 1.99 cm AV Vmax:        150.33 cm/s AV Peak Grad:   9.0 mmHg LVOT Vmax:      95.30 cm/s LVOT Vmean:     64.267 cm/s LVOT VTI:       0.134 m  AORTA Ao Root diam: 3.10 cm MITRAL VALVE MV Area (PHT): 5.01 cm    SHUNTS MV Decel Time: 152 msec    Systemic VTI:  0.13 m MV E velocity: 54.75 cm/s  Systemic Diam: 2.00 cm MV A velocity: 91.20 cm/s MV E/A ratio:  0.60 Dwayne D Callwood MD Electronically signed by Yolonda Kida MD Signature Date/Time: 04/19/2022/9:37:22 AM    Final    DG Chest Port 1 View  Result Date: 04/19/2022 CLINICAL DATA:  Fever EXAM: PORTABLE CHEST 1 VIEW COMPARISON:  CT chest 04/17/2022 FINDINGS: No pleural effusion. No pneumothorax. Likely unchanged cardiac and mediastinal contours compared to 04/17/2022 when accounting for differences in positioning. No new focal airspace opacity. Previously reported fractures on prior CT chest are  redemonstrated, but are better visualized on prior cross-sectional imaging. Degenerative changes of the bilateral AC joints. Visualized upper abdomen is unremarkable. IMPRESSION: No radiographic finding to explain fever. Electronically Signed   By: Marin Roberts M.D.   On: 04/19/2022 07:39   MR BRAIN WO CONTRAST  Result Date: 04/17/2022 CLINICAL DATA:  Transient ischemic attack EXAM: MRI HEAD WITHOUT CONTRAST TECHNIQUE: Multiplanar, multiecho pulse sequences of the brain and surrounding structures were obtained without intravenous contrast. COMPARISON:  None Available. FINDINGS: Brain: No acute infarct, mass effect or extra-axial collection. No acute or chronic hemorrhage. There is multifocal hyperintense T2-weighted signal within the white matter. Generalized volume loss. The midline structures are normal. Vascular: Major flow voids are preserved. Skull and upper cervical spine: Normal calvarium and skull base. Visualized upper cervical spine and soft tissues are normal. Sinuses/Orbits:No paranasal sinus fluid levels or advanced mucosal thickening. No mastoid or middle ear effusion. Normal orbits. IMPRESSION: 1. No acute  intracranial abnormality. 2. Findings of chronic small vessel ischemia and volume loss. Electronically Signed   By: Ulyses Jarred M.D.   On: 04/17/2022 21:56   CT Angio Chest PE W and/or Wo Contrast  Result Date: 04/17/2022 CLINICAL DATA:  Acute abdominal pain EXAM: CT ANGIOGRAPHY CHEST CT ABDOMEN AND PELVIS WITH CONTRAST TECHNIQUE: Multidetector CT imaging of the chest was performed using the standard protocol during bolus administration of intravenous contrast. Multiplanar CT image reconstructions and MIPs were obtained to evaluate the vascular anatomy. Multidetector CT imaging of the abdomen and pelvis was performed using the standard protocol during bolus administration of intravenous contrast. RADIATION DOSE REDUCTION: This exam was performed according to the departmental  dose-optimization program which includes automated exposure control, adjustment of the mA and/or kV according to patient size and/or use of iterative reconstruction technique. CONTRAST:  147m OMNIPAQUE IOHEXOL 350 MG/ML SOLN COMPARISON:  None available FINDINGS: CTA CHEST FINDINGS Cardiovascular: Evidence of pulmonary embolus. Normal heart size. No pericardial effusion. Moderate coronary artery calcifications of the LAD and RCA. Normal caliber thoracic aorta with moderate calcified plaque. Mediastinum/Nodes: Esophagus and thyroid are unremarkable. No pathologically enlarged lymph nodes seen in the chest. Lungs/Pleura: Central airways are patent. Mild centrilobular emphysema. No consolidation, pleural effusion or pneumothorax. Atelectasis of the lingula. Musculoskeletal: No chest wall abnormality. Mildly displaced fractures of the posterolateral right 11th and 10th ribs with evidence of callus formation. Review of the MIP images confirms the above findings. CT ABDOMEN and PELVIS FINDINGS Hepatobiliary: No suspicious liver lesions. Gallstones and gallbladder sludge with a 7 mm stone seen in the gallbladder neck on series 6, image 45. No gallbladder wall thickening. No biliary ductal dilation. Pancreas: Unremarkable. No pancreatic ductal dilatation or surrounding inflammatory changes. Spleen: Normal in size without focal abnormality. Adrenals/Urinary Tract: Bilateral adrenal glands are unremarkable. No hydronephrosis or nephrolithiasis. Tiny bilateral low-attenuation renal lesions which are too small to completely characterize but likely simple cysts, no further follow-up imaging is recommended. Stomach/Bowel: Stomach is within normal limits. Appendix appears normal. No evidence of bowel wall thickening, distention, or inflammatory changes. Vascular/Lymphatic: Aortic atherosclerosis. No enlarged abdominal or pelvic lymph nodes. Reproductive: Uterus and bilateral adnexa are unremarkable. Other: Small locule of  subcutaneous gas seen in the right lower abdominal wall on series 3, image 70, likely related to subcutaneous injection. Trace abdominal ascites. Musculoskeletal: No acute or significant osseous findings. Review of the MIP images confirms the above findings. IMPRESSION: 1. No evidence of pulmonary embolus or acute airspace opacity. 2. Cholelithiasis with a 7 mm stone seen in the gallbladder neck, although there is no evidence of gallbladder wall thickening. Correlate for symptoms of right upper quadrant pain and consider gallbladder ultrasound for further evaluation. 3. Trace abdominal ascites. 4. Small locule of subcutaneous gas seen in the right lower abdominal wall, likely related to subcutaneous injection. Correlate clinically. 5. Mildly displaced fractures of the posterolateral right 11th and 10th ribs with evidence of callus formation, likely subacute/chronic. 6. Moderate coronary artery calcifications of the LAD and RCA. 7. Aortic Atherosclerosis (ICD10-I70.0) and Emphysema (ICD10-J43.9). Electronically Signed   By: LYetta GlassmanM.D.   On: 04/17/2022 15:50   CT ABDOMEN PELVIS W CONTRAST  Result Date: 04/17/2022 CLINICAL DATA:  Acute abdominal pain EXAM: CT ANGIOGRAPHY CHEST CT ABDOMEN AND PELVIS WITH CONTRAST TECHNIQUE: Multidetector CT imaging of the chest was performed using the standard protocol during bolus administration of intravenous contrast. Multiplanar CT image reconstructions and MIPs were obtained to evaluate the vascular anatomy. Multidetector CT imaging of  the abdomen and pelvis was performed using the standard protocol during bolus administration of intravenous contrast. RADIATION DOSE REDUCTION: This exam was performed according to the departmental dose-optimization program which includes automated exposure control, adjustment of the mA and/or kV according to patient size and/or use of iterative reconstruction technique. CONTRAST:  118m OMNIPAQUE IOHEXOL 350 MG/ML SOLN COMPARISON:   None available FINDINGS: CTA CHEST FINDINGS Cardiovascular: Evidence of pulmonary embolus. Normal heart size. No pericardial effusion. Moderate coronary artery calcifications of the LAD and RCA. Normal caliber thoracic aorta with moderate calcified plaque. Mediastinum/Nodes: Esophagus and thyroid are unremarkable. No pathologically enlarged lymph nodes seen in the chest. Lungs/Pleura: Central airways are patent. Mild centrilobular emphysema. No consolidation, pleural effusion or pneumothorax. Atelectasis of the lingula. Musculoskeletal: No chest wall abnormality. Mildly displaced fractures of the posterolateral right 11th and 10th ribs with evidence of callus formation. Review of the MIP images confirms the above findings. CT ABDOMEN and PELVIS FINDINGS Hepatobiliary: No suspicious liver lesions. Gallstones and gallbladder sludge with a 7 mm stone seen in the gallbladder neck on series 6, image 45. No gallbladder wall thickening. No biliary ductal dilation. Pancreas: Unremarkable. No pancreatic ductal dilatation or surrounding inflammatory changes. Spleen: Normal in size without focal abnormality. Adrenals/Urinary Tract: Bilateral adrenal glands are unremarkable. No hydronephrosis or nephrolithiasis. Tiny bilateral low-attenuation renal lesions which are too small to completely characterize but likely simple cysts, no further follow-up imaging is recommended. Stomach/Bowel: Stomach is within normal limits. Appendix appears normal. No evidence of bowel wall thickening, distention, or inflammatory changes. Vascular/Lymphatic: Aortic atherosclerosis. No enlarged abdominal or pelvic lymph nodes. Reproductive: Uterus and bilateral adnexa are unremarkable. Other: Small locule of subcutaneous gas seen in the right lower abdominal wall on series 3, image 70, likely related to subcutaneous injection. Trace abdominal ascites. Musculoskeletal: No acute or significant osseous findings. Review of the MIP images confirms the  above findings. IMPRESSION: 1. No evidence of pulmonary embolus or acute airspace opacity. 2. Cholelithiasis with a 7 mm stone seen in the gallbladder neck, although there is no evidence of gallbladder wall thickening. Correlate for symptoms of right upper quadrant pain and consider gallbladder ultrasound for further evaluation. 3. Trace abdominal ascites. 4. Small locule of subcutaneous gas seen in the right lower abdominal wall, likely related to subcutaneous injection. Correlate clinically. 5. Mildly displaced fractures of the posterolateral right 11th and 10th ribs with evidence of callus formation, likely subacute/chronic. 6. Moderate coronary artery calcifications of the LAD and RCA. 7. Aortic Atherosclerosis (ICD10-I70.0) and Emphysema (ICD10-J43.9). Electronically Signed   By: LYetta GlassmanM.D.   On: 04/17/2022 15:50   CT HEAD WO CONTRAST (5MM)  Result Date: 04/17/2022 CLINICAL DATA:  Head trauma, minor (Age >= 65y) EXAM: CT HEAD WITHOUT CONTRAST TECHNIQUE: Contiguous axial images were obtained from the base of the skull through the vertex without intravenous contrast. RADIATION DOSE REDUCTION: This exam was performed according to the departmental dose-optimization program which includes automated exposure control, adjustment of the mA and/or kV according to patient size and/or use of iterative reconstruction technique. COMPARISON:  None Available. FINDINGS: Brain: No intracranial hemorrhage, mass effect, or midline shift. Normal brain volume for age. No hydrocephalus. The basilar cisterns are patent. Mild to moderate periventricular and deep white matter hypodensity typical of chronic small vessel ischemia. No evidence of territorial infarct or acute ischemia. No extra-axial or intracranial fluid collection. Vascular: Atherosclerosis of skullbase vasculature without hyperdense vessel or abnormal calcification. Skull: No fracture or focal lesion. Sinuses/Orbits: Partial opacification of bilateral  mastoid air cells. Paranasal sinuses are clear. Bilateral lens extraction. Other: None. IMPRESSION: 1. No acute intracranial abnormality. No skull fracture. 2. Mild to moderate chronic small vessel ischemia. Electronically Signed   By: Keith Rake M.D.   On: 04/17/2022 15:43   CT Cervical Spine Wo Contrast  Result Date: 04/17/2022 CLINICAL DATA:  Neck trauma EXAM: CT CERVICAL SPINE WITHOUT CONTRAST TECHNIQUE: Multidetector CT imaging of the cervical spine was performed without intravenous contrast. Multiplanar CT image reconstructions were also generated. RADIATION DOSE REDUCTION: This exam was performed according to the departmental dose-optimization program which includes automated exposure control, adjustment of the mA and/or kV according to patient size and/or use of iterative reconstruction technique. COMPARISON:  None Available. FINDINGS: Alignment: Straightening of the cervical spine. No subluxation. Facet alignment within normal limits. Skull base and vertebrae: No acute fracture. No primary bone lesion or focal pathologic process. Soft tissues and spinal canal: No prevertebral fluid or swelling. No visible canal hematoma. Disc levels: Mild disc space narrowing C5-C6 with mild foraminal narrowing. Upper chest: Emphysema Other: Mildly enlarged nonspecific lymph node posterior to the left parotid tail measuring 11 mm. IMPRESSION: 1. Straightening of the cervical spine. No acute osseous abnormality. 2. Emphysema Electronically Signed   By: Donavan Foil M.D.   On: 04/17/2022 15:30   DG Hip Unilat W or Wo Pelvis 2-3 Views Right  Result Date: 04/17/2022 CLINICAL DATA:  Fall. EXAM: DG HIP (WITH OR WITHOUT PELVIS) 2-3V RIGHT COMPARISON:  None Available. FINDINGS: There is no evidence of hip fracture or dislocation. There is no evidence of arthropathy or other focal bone abnormality. IMPRESSION: Negative. Electronically Signed   By: Misty Stanley M.D.   On: 04/17/2022 14:00   DG Chest 2  View  Result Date: 04/17/2022 CLINICAL DATA:  Shortness of breath. EXAM: CHEST - 2 VIEW COMPARISON:  None Available. FINDINGS: 1342 hours. Lungs are hyperexpanded. The cardio pericardial silhouette is enlarged. Interstitial markings are diffusely coarsened with chronic features. No focal consolidation or pleural effusion. Telemetry leads overlie the chest. IMPRESSION: Emphysema without acute cardiopulmonary findings. Electronically Signed   By: Misty Stanley M.D.   On: 04/17/2022 13:59   DG Lumbar Spine 2-3 Views  Result Date: 04/15/2022 CLINICAL DATA:  L4-L5 decompression EXAM: LUMBAR SPINE - 2-3 VIEW COMPARISON:  None Available. FINDINGS: Intraoperative fluoroscopic images during L4-L5 decompression. On image 3, localization probe is at the level of L4-L5. IMPRESSION: Intraoperative fluoroscopic images for localization as described above. Electronically Signed   By: Maurine Simmering M.D.   On: 04/15/2022 08:52   DG C-Arm 1-60 Min-No Report  Result Date: 04/15/2022 Fluoroscopy was utilized by the requesting physician.  No radiographic interpretation.      Subjective: Seen and examined on day of dc.  Stable, no distress  Discharge Exam: Vitals:   04/23/22 0523 04/23/22 0824  BP: 133/69 (!) 152/67  Pulse: 86 97  Resp: 17 17  Temp: 98 F (36.7 C) 97.9 F (36.6 C)  SpO2: 98% 100%   Vitals:   04/22/22 1947 04/23/22 0500 04/23/22 0523 04/23/22 0824  BP: 127/61  133/69 (!) 152/67  Pulse: 89  86 97  Resp: '20  17 17  '$ Temp: 99.5 F (37.5 C)  98 F (36.7 C) 97.9 F (36.6 C)  TempSrc:      SpO2: 95%  98% 100%  Weight:  78.2 kg    Height:        General: Pt is alert, awake, not in acute distress Cardiovascular: RRR, S1/S2 +,  no rubs, no gallops Respiratory: CTA bilaterally, no wheezing, no rhonchi Abdominal: Soft, NT, ND, bowel sounds + Extremities: no edema, no cyanosis    The results of significant diagnostics from this hospitalization (including imaging, microbiology,  ancillary and laboratory) are listed below for reference.     Microbiology: Recent Results (from the past 240 hour(s))  SARS Coronavirus 2 by RT PCR (hospital order, performed in [facility] hospital lab) *cepheid single result test* Anterior Nasal Swab     Status: None   Collection Time: 04/17/22  4:33 PM   Specimen: Anterior Nasal Swab  Result Value Ref Range Status   SARS Coronavirus 2 by RT PCR NEGATIVE NEGATIVE Final    Comment: (NOTE) SARS-CoV-2 target nucleic acids are NOT DETECTED.  The SARS-CoV-2 RNA is generally detectable in upper and lower respiratory specimens during the acute phase of infection. The lowest concentration of SARS-CoV-2 viral copies this assay can detect is 250 copies / mL. A negative result does not preclude SARS-CoV-2 infection and should not be used as the sole basis for treatment or other patient management decisions.  A negative result may occur with improper specimen collection / handling, submission of specimen other than nasopharyngeal swab, presence of viral mutation(s) within the areas targeted by this assay, and inadequate number of viral copies (<250 copies / mL). A negative result must be combined with clinical observations, patient history, and epidemiological information.  Fact Sheet for Patients:   https://www.patel.info/  Fact Sheet for Healthcare Providers: https://hall.com/  This test is not yet approved or  cleared by the [place] FDA and has been authorized for detection and/or diagnosis of SARS-CoV-2 by FDA under an Emergency Use Authorization (EUA).  This EUA will remain in effect (meaning this test can be used) for the duration of the COVID-19 declaration under Section 564(b)(1) of the Act, 21 U.S.C. section 360bbb-3(b)(1), unless the authorization is terminated or revoked sooner.  Performed at [facility], [address] [place], [place] [address]   Culture, blood  (Routine X 2) w Reflex to ID Panel     Status: None (Preliminary result)   Collection Time: 04/19/22  9:03 AM   Specimen: BLOOD  Result Value Ref Range Status   Specimen Description BLOOD LEFT ANTECUBITAL  Final   Special Requests   Final    BOTTLES DRAWN AEROBIC AND ANAEROBIC Blood Culture adequate volume   Culture   Final    NO GROWTH 4 DAYS Performed at [facility], [address] [place], [place] [address]    Report Status PENDING  Incomplete  Culture, blood (Routine X 2) w Reflex to ID Panel     Status: None (Preliminary result)   Collection Time: 04/19/22  9:46 AM   Specimen: BLOOD  Result Value Ref Range Status   Specimen Description BLOOD LEFT ANTECUBITAL  Final   Special Requests   Final    BOTTLES DRAWN AEROBIC AND ANAEROBIC Blood Culture adequate volume   Culture   Final    NO GROWTH 4 DAYS Performed at [facility], [address], [place], [place] [address]    Report Status PENDING  Incomplete  C Difficile Quick Screen w PCR reflex     Status: Abnormal   Collection Time: 04/19/22  2:01 PM   Specimen: STOOL  Result Value Ref Range Status   C Diff antigen POSITIVE (A) NEGATIVE Final   C Diff toxin POSITIVE (A) NEGATIVE Final   C Diff interpretation Toxin producing C. difficile detected.  Final    Comment: CRITICAL RESULT CALLED TO, READ BACK BY AND VERIFIED WITHJenny Reichmann [facility] AT 5732 04/19/22.PMF Performed at [facility], [address] [place], [place] [address]      Labs: BNP (last 3 results) Recent Labs    04/17/22 2013  BNP 270.6*   Basic Metabolic Panel: Recent Labs  Lab 04/17/22 1259 04/18/22 0106 04/18/22 1300 04/19/22 0523 04/20/22 0550 04/21/22 0657 04/22/22 0544  NA 137 135  --  136 136 136 138  K 3.1* 2.6* 3.9 3.0* 2.8* 4.8 4.2  CL 105 104  --  106 107 112* 107  CO2 26 21*  --  23 22 19* 24  GLUCOSE 108* 105*  --  109* 105* 95 105*  BUN 16 7*  --  8 8 <5* <5*  CREATININE 0.89 0.50  --  0.61 0.62 0.53  0.65  CALCIUM 8.4* 7.5*  --  7.7* 7.7* 8.5* 8.7*  MG 1.7 2.2  --  1.9 2.1  --  1.9  PHOS  --   --   --  1.5* 2.3* 2.8 4.7*   Liver Function Tests: Recent Labs  Lab 04/17/22 1259 04/18/22 0106 04/22/22 0544  AST 23 18 34  ALT '11 10 19  '$ ALKPHOS 101 103 132*  BILITOT 0.9 0.9 0.6  PROT 6.0* 5.6* 5.5*  ALBUMIN 3.1* 2.9* 2.6*   No results for input(s): "LIPASE", "AMYLASE" in the last 168 hours. No results for input(s): "AMMONIA" in the last 168 hours. CBC: Recent Labs  Lab 04/17/22 1259 04/18/22 0106 04/21/22 0657 04/22/22 0544  WBC 8.4 9.1  --  4.6  HGB 13.2 13.5 12.1 12.0  HCT 41.8 41.5  --  37.7  MCV 91.1 88.3  --  90.2  PLT 154 144*  --  154   Cardiac Enzymes: No results for input(s): "CKTOTAL", "CKMB", "CKMBINDEX", "TROPONINI" in the last 168 hours. BNP: Invalid input(s): "POCBNP" CBG: No results for input(s): "GLUCAP" in the last 168 hours. D-Dimer No results for input(s): "DDIMER" in the last 72 hours. Hgb A1c No results for input(s): "HGBA1C" in the last 72 hours. Lipid Profile No results for input(s): "CHOL", "HDL", "LDLCALC", "TRIG", "CHOLHDL", "LDLDIRECT" in the last 72 hours. Thyroid function studies No results for input(s): "TSH", "T4TOTAL", "T3FREE", "THYROIDAB" in the last 72 hours.  Invalid input(s): "FREET3" Anemia work up No results for input(s): "VITAMINB12", "FOLATE", "FERRITIN", "TIBC", "IRON", "RETICCTPCT" in the last 72 hours. Urinalysis    Component Value Date/Time   COLORURINE YELLOW (A) 04/17/2022 1633   APPEARANCEUR CLEAR (A) 04/17/2022 1633   LABSPEC 1.021 04/17/2022 1633   PHURINE 5.0 04/17/2022 1633   GLUCOSEU NEGATIVE 04/17/2022 1633   HGBUR NEGATIVE 04/17/2022 1633   BILIRUBINUR NEGATIVE 04/17/2022 1633   KETONESUR 5 (A) 04/17/2022 1633   PROTEINUR NEGATIVE 04/17/2022 1633   NITRITE NEGATIVE 04/17/2022 1633   LEUKOCYTESUR NEGATIVE 04/17/2022 1633   Sepsis Labs Recent Labs  Lab 04/17/22 1259 04/18/22 0106 04/22/22 0544   WBC 8.4 9.1 4.6   Microbiology Recent Results (from the past 240 hour(s))  SARS Coronavirus 2 by RT PCR (hospital order, performed in [place] hospital lab) *cepheid single result test* Anterior Nasal Swab     Status: None   Collection Time: 04/17/22  4:33 PM   Specimen: Anterior Nasal Swab  Result Value Ref Range Status   SARS Coronavirus 2 by RT PCR NEGATIVE NEGATIVE Final    Comment: (NOTE) SARS-CoV-2 target nucleic acids are NOT DETECTED.  The SARS-CoV-2 RNA  is generally detectable in upper and lower respiratory specimens during the acute phase of infection. The lowest concentration of SARS-CoV-2 viral copies this assay can detect is 250 copies / mL. A negative result does not preclude SARS-CoV-2 infection and should not be used as the sole basis for treatment or other patient management decisions.  A negative result may occur with improper specimen collection / handling, submission of specimen other than nasopharyngeal swab, presence of viral mutation(s) within the areas targeted by this assay, and inadequate number of viral copies (<250 copies / mL). A negative result must be combined with clinical observations, patient history, and epidemiological information.  Fact Sheet for Patients:   https://www.patel.info/  Fact Sheet for Healthcare Providers: https://hall.com/  This test is not yet approved or  cleared by the [place] FDA and has been authorized for detection and/or diagnosis of SARS-CoV-2 by FDA under an Emergency Use Authorization (EUA).  This EUA will remain in effect (meaning this test can be used) for the duration of the COVID-19 declaration under Section 564(b)(1) of the Act, 21 U.S.C. section 360bbb-3(b)(1), unless the authorization is terminated or revoked sooner.  Performed at [facility], [address] [place], [place] [address]   Culture, blood (Routine X 2) w Reflex to ID Panel      Status: None (Preliminary result)   Collection Time: 04/19/22  9:03 AM   Specimen: BLOOD  Result Value Ref Range Status   Specimen Description BLOOD LEFT ANTECUBITAL  Final   Special Requests   Final    BOTTLES DRAWN AEROBIC AND ANAEROBIC Blood Culture adequate volume   Culture   Final    NO GROWTH 4 DAYS Performed at [facility], [address], [place], [place] [address]    Report Status PENDING  Incomplete  Culture, blood (Routine X 2) w Reflex to ID Panel     Status: None (Preliminary result)   Collection Time: 04/19/22  9:46 AM   Specimen: BLOOD  Result Value Ref Range Status   Specimen Description BLOOD LEFT ANTECUBITAL  Final   Special Requests   Final    BOTTLES DRAWN AEROBIC AND ANAEROBIC Blood Culture adequate volume   Culture   Final    NO GROWTH 4 DAYS Performed at [facility], [address], [place], [place] [address]    Report Status PENDING  Incomplete  C Difficile Quick Screen w PCR reflex     Status: Abnormal   Collection Time: 04/19/22  2:01 PM   Specimen: STOOL  Result Value Ref Range Status   C Diff antigen POSITIVE (A) NEGATIVE Final   C Diff toxin POSITIVE (A) NEGATIVE Final   C Diff interpretation Toxin producing C. difficile detected.  Final    Comment: CRITICAL RESULT CALLED TO, READ BACK BY AND VERIFIED WITHJenny Reichmann [facility] AT 9417 04/19/22.PMF Performed at [facility], [address] [place], [place] [address]      Time coordinating discharge: Over 30 minutes  SIGNED:   Sidney Ace, MD  [facility] 04/23/2022, 9:09 AM Pager   If 7PM-7AM, please contact night-coverage

## 2022-04-23 NOTE — Progress Notes (Signed)
OT Cancellation Note  Patient Details Name: Kristen Potts MRN: [id number] DOB: 06/12/1946   Cancelled Treatment:    Reason Eval/Treat Not Completed: Other (comment). Consult received, chart reviewed. Pt noted with discharge orders in, plans to transfer to SNF today. Will hold OT evaluation at this time and re-attempt should pt not discharge.   Ardeth Perfect., MPH, MS, OTR/L ascom [phone] 04/23/22, 10:24 AM

## 2022-04-23 NOTE — Plan of Care (Signed)
  Problem: Health Behavior/Discharge Planning: Goal: Ability to manage health-related needs will improve Outcome: Progressing   Problem: Clinical Measurements: Goal: Cardiovascular complication will be avoided Outcome: Progressing   Problem: Activity: Goal: Risk for activity intolerance will decrease Outcome: Progressing   Problem: Nutrition: Goal: Adequate nutrition will be maintained Outcome: Progressing   Problem: Pain Managment: Goal: General experience of comfort will improve Outcome: Progressing

## 2022-04-24 LAB — CULTURE, BLOOD (ROUTINE X 2)
Culture: NO GROWTH
Culture: NO GROWTH
Special Requests: ADEQUATE
Special Requests: ADEQUATE

## 2022-04-30 NOTE — Progress Notes (Signed)
   REFERRING PHYSICIAN:  Rusty Aus, Md [address] [facility] [place],  [place] [address]  DOS: 04/15/22 L4-5 lumbar decompression including central laminectomy and bilateral medial facetectomies including foraminotomies  HISTORY OF PRESENT ILLNESS: Kristen Potts is approximately 2-3 weeks status post L4-5 lumbar decompression including central laminectomy and bilateral medial facetectomies including foraminotomies. Was given robaxin and oxycodone on discharge from the hospital.   She was readmitted to the hospital on POD #2 s/p fall with constipation and urinary frequency. CT of abdomen and pelvis showed lumbar spine laminectomy defect, no obvious fluid collection She tested positive for Cdiff.   She was discharged to SNF  She is here for follow up.   She is doing well from a spine standpoint. She has no pain in her back. No leg pain. She feels like her preop leg symptoms are gone!  She is doing PT at [facility]. She is planning on going home on Monday.   PHYSICAL EXAMINATION:  General: Patient is well developed, well nourished, calm, collected, and in no apparent distress.   NEUROLOGICAL:  General: In no acute distress.   Awake, alert, oriented to person, place, and time.  Pupils equal round and reactive to light.  Facial tone is symmetric.     Strength:            Side Iliopsoas Quads Hamstring PF DF EHL  R '5 5 5 5 5 5  '$ L '5 5 5 5 5 5   '$ Incision c/d/I with sutures intact  She is in a WC   ROS (Neurologic):  Negative except as noted above  IMAGING: Nothing new to review.   ASSESSMENT/PLAN:  Kristen Potts is doing reasonable s/p above surgery. Treatment options reviewed with patient and following plan made:   - I have advised the patient to lift up to 10 pounds until 6 weeks after surgery (follow up with Dr. Izora Ribas).  - Sutures removed without complication. Reviewed wound care.  - No bending, twisting, or lifting.  - Continue on current  medications- has not taken pain medication in a few days.  - Follow up as scheduled in 4 weeks and prn.   Advised to contact the office if any questions or concerns arise.  Geronimo Boot PA-C Department of neurosurgery

## 2022-05-01 ENCOUNTER — Encounter: Payer: Self-pay | Admitting: Orthopedic Surgery

## 2022-05-01 ENCOUNTER — Ambulatory Visit (INDEPENDENT_AMBULATORY_CARE_PROVIDER_SITE_OTHER): Payer: Medicare HMO | Admitting: Orthopedic Surgery

## 2022-05-01 VITALS — BP 142/66 | HR 91 | Temp 97.7°F | Ht 67.0 in | Wt 164.1 lb

## 2022-05-01 DIAGNOSIS — M7138 Other bursal cyst, other site: Secondary | ICD-10-CM

## 2022-05-01 DIAGNOSIS — Z9889 Other specified postprocedural states: Secondary | ICD-10-CM

## 2022-05-01 DIAGNOSIS — M5416 Radiculopathy, lumbar region: Secondary | ICD-10-CM

## 2022-05-01 DIAGNOSIS — Z09 Encounter for follow-up examination after completed treatment for conditions other than malignant neoplasm: Secondary | ICD-10-CM

## 2022-05-01 NOTE — Addendum Note (Signed)
Addended by: Berdine Addison on: 05/01/2022 04:08 PM   Modules accepted: Orders

## 2022-05-05 ENCOUNTER — Encounter: Payer: Medicare HMO | Admitting: Neurosurgery

## 2022-05-21 ENCOUNTER — Encounter: Payer: Medicare HMO | Admitting: Neurosurgery

## 2022-05-26 ENCOUNTER — Encounter: Payer: Self-pay | Admitting: Neurosurgery

## 2022-05-26 ENCOUNTER — Ambulatory Visit (INDEPENDENT_AMBULATORY_CARE_PROVIDER_SITE_OTHER): Payer: Medicare HMO | Admitting: Neurosurgery

## 2022-05-26 VITALS — BP 124/74 | Ht 67.0 in | Wt 164.0 lb

## 2022-05-26 DIAGNOSIS — M7138 Other bursal cyst, other site: Secondary | ICD-10-CM

## 2022-05-26 DIAGNOSIS — M5416 Radiculopathy, lumbar region: Secondary | ICD-10-CM

## 2022-05-26 DIAGNOSIS — Z09 Encounter for follow-up examination after completed treatment for conditions other than malignant neoplasm: Secondary | ICD-10-CM

## 2022-05-26 NOTE — Progress Notes (Signed)
   REFERRING PHYSICIAN:  Rusty Aus, Md [address] [facility] [place],  [place] [address]  DOS: 04/15/22 L4-5 lumbar decompression including central laminectomy and bilateral medial facetectomies including foraminotomies  HISTORY OF PRESENT ILLNESS: Nila Winker is status post L4-5 lumbar decompression including central laminectomy and bilateral medial facetectomies including foraminotomies.   She continues to have weakness in her left leg and pain in the left knee.    PHYSICAL EXAMINATION:  General: Patient is well developed, well nourished, calm, collected, and in no apparent distress.   NEUROLOGICAL:  General: In no acute distress.   Awake, alert, oriented to person, place, and time.  Pupils equal round and reactive to light.  Facial tone is symmetric.     Strength:            Side Iliopsoas Quads Hamstring PF DF EHL  R '5 5 5 5 5 5  '$ L 4- 4- 4- 4- 4- 4-   Incision c/d/I with sutures intact  She is in a WC   ROS (Neurologic):  Negative except as noted above  IMAGING: Nothing new to review.   ASSESSMENT/PLAN:  Kristen Potts is doing better s/p above surgery.  She continues to do physical therapy at home.  I think she will have a slow and long recovery path given how deconditioned she was prior to surgery.  She is also having left knee pain.  She will discuss her current knee pain with Dr. Sabra Heck next week.  He has been giving her cortisone injections.   I think she will continue to slowly improve.  I will see her back in 6 weeks.  Meade Maw MD Department of neurosurgery

## 2022-06-18 ENCOUNTER — Encounter: Payer: Medicare HMO | Admitting: Neurosurgery

## 2022-06-25 ENCOUNTER — Encounter: Payer: Medicare HMO | Admitting: Neurosurgery

## 2022-07-09 ENCOUNTER — Encounter: Payer: Medicare HMO | Admitting: Neurosurgery

## 2022-08-10 ENCOUNTER — Other Ambulatory Visit (INDEPENDENT_AMBULATORY_CARE_PROVIDER_SITE_OTHER): Payer: Self-pay | Admitting: Nurse Practitioner

## 2022-08-10 DIAGNOSIS — I739 Peripheral vascular disease, unspecified: Secondary | ICD-10-CM

## 2022-08-11 ENCOUNTER — Ambulatory Visit (INDEPENDENT_AMBULATORY_CARE_PROVIDER_SITE_OTHER): Payer: Medicare HMO | Admitting: Vascular Surgery

## 2022-08-11 ENCOUNTER — Encounter (INDEPENDENT_AMBULATORY_CARE_PROVIDER_SITE_OTHER): Payer: Medicare HMO

## 2022-12-29 ENCOUNTER — Ambulatory Visit (INDEPENDENT_AMBULATORY_CARE_PROVIDER_SITE_OTHER): Payer: Medicare HMO | Admitting: Neurosurgery

## 2022-12-29 ENCOUNTER — Encounter: Payer: Self-pay | Admitting: Neurosurgery

## 2022-12-29 VITALS — BP 134/76 | Ht 67.0 in | Wt 166.2 lb

## 2022-12-29 DIAGNOSIS — G8929 Other chronic pain: Secondary | ICD-10-CM | POA: Diagnosis not present

## 2022-12-29 DIAGNOSIS — M5416 Radiculopathy, lumbar region: Secondary | ICD-10-CM

## 2022-12-29 DIAGNOSIS — M5442 Lumbago with sciatica, left side: Secondary | ICD-10-CM

## 2022-12-29 NOTE — Progress Notes (Signed)
       REFERRING PHYSICIAN:  Danella Penton, Md [address] [facility] [place],  [place] [address]  DOS: 04/15/22 L4-5 lumbar decompression including central laminectomy and bilateral medial facetectomies including foraminotomies  HISTORY OF PRESENT ILLNESS: Kristen Potts is status post L4-5 lumbar decompression including central laminectomy and bilateral medial facetectomies including foraminotomies.   She was previously doing quite well, but has had worsening pain in her left buttock and down her leg over the past 6 weeks or so.  She had an injection on June 12 without improvement in Dr. Rondel Baton office.  She has been doing physical therapy of her knee.   PHYSICAL EXAMINATION:  General: Patient is well developed, well nourished, calm, collected, and in no apparent distress.   NEUROLOGICAL:  General: In no acute distress.   Awake, alert, oriented to person, place, and time.  Pupils equal round and reactive to light.  Facial tone is symmetric.     Strength:            Side Iliopsoas Quads Hamstring PF DF EHL  R 5 5 5 5 5 5   L 4 4+ 4+ 4+ 4+ 4   Incision c/d/I with sutures intact   ROS (Neurologic):  Negative except as noted above  IMAGING: Nothing new to review.   ASSESSMENT/PLAN:  Kristen Potts is doing fair.  She has had a worsening of her symptoms recently.  It is possible that she is having a new problem that is causing her pain to recur.  I would like to send her for an injection with Dr. Yves Dill and will start physical therapy for her back.  I will see her back in 8 weeks.  If she is not better at that time, we will get an MRI scan.     Venetia Night MD Department of neurosurgery

## 2023-02-23 ENCOUNTER — Encounter: Payer: Self-pay | Admitting: Neurosurgery

## 2023-02-23 ENCOUNTER — Ambulatory Visit: Payer: Medicare HMO | Admitting: Neurosurgery

## 2023-02-23 DIAGNOSIS — M25562 Pain in left knee: Secondary | ICD-10-CM

## 2023-02-23 DIAGNOSIS — M5416 Radiculopathy, lumbar region: Secondary | ICD-10-CM

## 2023-02-23 DIAGNOSIS — G8929 Other chronic pain: Secondary | ICD-10-CM

## 2023-02-23 NOTE — Progress Notes (Unsigned)
    REFERRING PHYSICIAN:  No referring provider defined for this encounter.  DOS: 04/15/22 L4-5 lumbar decompression including central laminectomy and bilateral medial facetectomies including foraminotomies  HISTORY OF PRESENT ILLNESS: Rexine Hingtgen is status post L4-5 lumbar decompression.   At our last visit, she had had some return of symptoms.  She underwent a right L5-S1 and right S1 transforaminal epidural steroid injection on January 20, 2023.     PHYSICAL EXAMINATION:  General: Patient is well developed, well nourished, calm, collected, and in no apparent distress.   NEUROLOGICAL:  General: In no acute distress.   Awake, alert, oriented to person, place, and time.  Pupils equal round and reactive to light.  Facial tone is symmetric.     Strength:            Side Iliopsoas Quads Hamstring PF DF EHL  R 5 5 5 5 5 5   L 4+ 4+ 4+ 4+ 4+ 4   Incision c/d/I with sutures intact   ROS (Neurologic):  Negative except as noted above  IMAGING: MRI L spine 02/20/2022 IMPRESSION: 1.  No acute osseous injury of the lumbar spine. 2. At L4-5 there is a broad-based disc bulge. Severe right facet arthropathy with a inspissated 10 mm right facet intraspinal synovial cyst with mass effect on the intraspinal nerve roots. Mild left facet arthropathy. Severe spinal stenosis. Moderate right foraminal stenosis. 3. At L3-4 there is a broad-based disc bulge. Mild bilateral facet arthropathy. Bilateral lateral recess stenosis. Moderate left foraminal stenosis. 4. At L2-3 there is a broad-based disc bulge. Mild spinal stenosis.     Electronically Signed   By: Elige Ko M.D.   On: 02/20/2022 21:25  ASSESSMENT/PLAN:  Alvira Muha is doing better than prior.  Her back is bothering her somewhat, but her leg pain is improved.  She is having considerable pain around her left knee.  I have discussed her current situation with Dr. Audelia Acton.  He thought there may be some additional options for  her knee.  He will see her back in clinic.  I will see her back on an as-needed basis.  Venetia Night MD Department of neurosurgery

## 2023-07-13 ENCOUNTER — Other Ambulatory Visit: Payer: Self-pay | Admitting: Family Medicine

## 2023-07-13 DIAGNOSIS — M5416 Radiculopathy, lumbar region: Secondary | ICD-10-CM

## 2023-07-30 ENCOUNTER — Ambulatory Visit
Admission: RE | Admit: 2023-07-30 | Discharge: 2023-07-30 | Disposition: A | Discharge status: Home / Self Care | Payer: Medicare HMO | Source: Ambulatory Visit | Attending: Family Medicine | Admitting: Family Medicine

## 2023-07-30 DIAGNOSIS — M5416 Radiculopathy, lumbar region: Secondary | ICD-10-CM
